# Patient Record
Sex: Female | Born: 1990 | Race: Black or African American | Hispanic: No | Marital: Single | Smoking: Former smoker
Health system: Southern US, Community
[De-identification: ages and names within clinical notes are randomized; demographics above are authoritative.]

## PROBLEM LIST (undated history)

## (undated) ENCOUNTER — Inpatient Hospital Stay (HOSPITAL_COMMUNITY): Payer: Self-pay

## (undated) DIAGNOSIS — S92909A Unspecified fracture of unspecified foot, initial encounter for closed fracture: Secondary | ICD-10-CM

## (undated) DIAGNOSIS — I10 Essential (primary) hypertension: Secondary | ICD-10-CM

## (undated) HISTORY — DX: Unspecified fracture of unspecified foot, initial encounter for closed fracture: S92.909A

## (undated) HISTORY — DX: Essential (primary) hypertension: I10

## (undated) HISTORY — PX: NO PAST SURGERIES: SHX2092

---

## 2015-02-14 ENCOUNTER — Emergency Department (INDEPENDENT_AMBULATORY_CARE_PROVIDER_SITE_OTHER)
Admission: EM | Admit: 2015-02-14 | Discharge: 2015-02-14 | Disposition: A | Discharge status: Home / Self Care | Payer: Self-pay | Source: Home / Self Care | Attending: Emergency Medicine | Admitting: Emergency Medicine

## 2015-02-14 ENCOUNTER — Encounter (HOSPITAL_COMMUNITY): Payer: Self-pay | Admitting: Emergency Medicine

## 2015-02-14 DIAGNOSIS — R519 Headache, unspecified: Secondary | ICD-10-CM

## 2015-02-14 DIAGNOSIS — M7671 Peroneal tendinitis, right leg: Secondary | ICD-10-CM

## 2015-02-14 DIAGNOSIS — R51 Headache: Secondary | ICD-10-CM

## 2015-02-14 MED ORDER — PREDNISONE 50 MG PO TABS: ORAL_TABLET | ORAL | Status: DC

## 2015-02-14 NOTE — Discharge Instructions (Signed)
The patient in your ankle is coming from inflamed tendons. Take prednisone daily for 5 days to help get rid of the inflammation. Apply ice as often as you can, but especially after work. Wear the brace whenever you are moving around. Do the exercises I showed you to help stretch and strengthen the tendons.  The goal is to do 3 sets of 10 twice a day. It will likely take some time before you reach this. You can also take Tylenol or Advil as needed for pain. If this not improving in the next 1-2 weeks, please follow-up at the sports medicine center.  Your headaches are likely coming from an out-of-date glasses prescription. Please get your vision checked to see if you need new glasses.

## 2015-02-14 NOTE — ED Notes (Signed)
Multiple complaints.  Patient reports right ankle pain, no known injury.  Pain for greater than 6 months Patient reports headaches.  Reports headaches occur intermittently.  Occurs every 3 days randomly and lasts 30 minutes and blurry vision, then all resolves.  Patient reports sharp, shooting chest pain across top of chest with headaches.  These episodes have been going on for 2 months.  Patient has seen physicians for all this and has been told to get a pcp.  Patient has medicaid and has not reapplied.

## 2015-02-14 NOTE — ED Provider Notes (Addendum)
CSN: [id number]     Arrival date & time 02/14/15  1300 History   First MD Initiated Contact with Patient 02/14/15 1318     Chief Complaint  Patient presents with  . Headache   (Consider location/radiation/quality/duration/timing/severity/associated sxs/prior Treatment) HPI She is a 25 year old woman here for evaluation of right ankle pain and headache.  She states she's been having ankle pain for the last 6 months or so. It has been worse in the last month since she fell at work. She states she has seen several other doctors, but no one has been able to tell her what is going on. The pain is in the lateral aspect of the ankle. It is worse with pointing and flexing the foot. It is worse after working a shift at work. She does wear supportive shoes at work. She also reports some swelling at the lateral ankle after work.  She also reports frequent headaches over the last several months. She states they occur every second to third day. They last about 30 minutes and resolve spontaneously or with medication. They're located frontally and behind the eyes. Sometimes she feels nauseous or sensitive to light with them but not all the time. She does wear glasses and has not had an eye exam in several years.  History reviewed. No pertinent past medical history. History reviewed. No pertinent past surgical history. No family history on file. Social History  Substance Use Topics  . Smoking status: Never Smoker   . Smokeless tobacco: None  . Alcohol Use: No   OB History    No data available     Review of Systems As in history of present illness Allergies  Review of patient's allergies indicates no known allergies.  Home Medications   Prior to Admission medications   Medication Sig Start Date End Date Taking? Authorizing Provider  predniSONE (DELTASONE) 50 MG tablet Take 1 pill daily for 5 days. 02/14/15   Charm Rings, MD   Meds Ordered and Administered this Visit  Medications - No data to  display  BP 135/84 mmHg  Pulse 69  Temp(Src) 98.5 F (36.9 C) (Oral)  Resp 16  SpO2 95% No data found.   Physical Exam  Constitutional: She is oriented to person, place, and time. She appears well-developed and well-nourished. No distress.  Cardiovascular: Normal rate.   Pulmonary/Chest: Effort normal.  Musculoskeletal:  Right ankle: Mild swelling at the lateral ankle. No erythema. She has full active range of motion and 5 out of 5 strength in the ankle. She does have some point tenderness along the peroneal tendons. She has pain with stretching of the peroneal tendons. 2+ DP pulse.  Neurological: She is alert and oriented to person, place, and time.  Normal gait    ED Course  Procedures (including critical care time)  Labs Review Labs Reviewed - No data to display  Imaging Review No results found.   MDM   1. Peroneal tendonitis, right   2. Headache, unspecified headache type    Treat tendinitis with prednisone, ASO brace, icing, and rehabilitation exercises. Discussed that this will take weeks to months to heal. Follow-up with sports medicine if not improving. I suspect her headaches are secondary to an out-of-date glasses prescription. Recommended follow-up with optometrist to get a new prescription.    Charm Rings, MD 02/14/15 4098  Charm Rings, MD 02/14/15 1191

## 2016-10-29 ENCOUNTER — Ambulatory Visit
Admission: EM | Admit: 2016-10-29 | Discharge: 2016-10-29 | Disposition: A | Discharge status: Home / Self Care | Payer: 59 | Attending: Family Medicine | Admitting: Family Medicine

## 2016-10-29 ENCOUNTER — Encounter: Payer: Self-pay | Admitting: *Deleted

## 2016-10-29 DIAGNOSIS — Z3202 Encounter for pregnancy test, result negative: Secondary | ICD-10-CM | POA: Diagnosis not present

## 2016-10-29 DIAGNOSIS — N3000 Acute cystitis without hematuria: Secondary | ICD-10-CM

## 2016-10-29 DIAGNOSIS — R1030 Lower abdominal pain, unspecified: Secondary | ICD-10-CM

## 2016-10-29 DIAGNOSIS — R51 Headache: Secondary | ICD-10-CM

## 2016-10-29 LAB — URINALYSIS, COMPLETE (UACMP) WITH MICROSCOPIC
Bilirubin Urine: NEGATIVE
GLUCOSE, UA: NEGATIVE mg/dL
Ketones, ur: NEGATIVE mg/dL
Leukocytes, UA: NEGATIVE
Nitrite: POSITIVE — AB
PH: 7 (ref 5.0–8.0)
Protein, ur: NEGATIVE mg/dL
SPECIFIC GRAVITY, URINE: 1.02 (ref 1.005–1.030)

## 2016-10-29 LAB — PREGNANCY, URINE: PREG TEST UR: NEGATIVE

## 2016-10-29 MED ORDER — CEPHALEXIN 500 MG PO CAPS: 500.0000 mg | ORAL_CAPSULE | Freq: Two times a day (BID) | ORAL | 0 refills | Status: DC

## 2016-10-29 NOTE — ED Triage Notes (Signed)
For over two weeks pt has been having lower abd cramping and nausea. Pt had two periods in one moth.

## 2016-10-29 NOTE — Discharge Instructions (Signed)
Antibiotic as prescribed.  Please follow up with your PCP or OB GYN.  Take care  Dr. Adriana Simas

## 2016-10-29 NOTE — ED Provider Notes (Signed)
[facility]    CSN: [id number] Arrival date & time: 10/29/16  0848     History   Chief Complaint Chief Complaint  Patient presents with  . Abdominal Cramping  . Headache   HPI  26 year old female presents with lower abdominal pain, nausea, headache, menstrual issues.  Patient reports a two-week history of sharp bilateral lower abdominal pain. Associated nausea. She states she's had intermittent headaches as well. Had a cycle approximately 2 weeks ago and has recently started spotting. She has an IUD in place. No vomiting. No fever or chills. Patient does endorse some urinary frequency. No dysuria.. She states that her blood pressure has been a little high. She is not followed regularly by a physician. She seems to be concerned that she may be pregnant. No known exacerbating or relieving factors. No other reported symptoms. No other complaints or concerns at this time.  History reviewed. No pertinent past medical history.  History reviewed. No pertinent surgical history.  OB History    No data available     Home Medications    Prior to Admission medications   Medication Sig Start Date End Date Taking? Authorizing Provider  cephALEXin (KEFLEX) 500 MG capsule Take 1 capsule (500 mg total) by mouth 2 (two) times daily. 10/29/16   Tommie Sams, DO  predniSONE (DELTASONE) 50 MG tablet Take 1 pill daily for 5 days. 02/14/15   Charm Rings, MD   Family History History reviewed. No pertinent family history.  Social History Social History  Substance Use Topics  . Smoking status: Current Every Day Smoker    Years: 2.00    Types: Cigars  . Smokeless tobacco: Never Used     Comment: 1  day  . Alcohol use No   Allergies   Patient has no known allergies.  Review of Systems Review of Systems  Constitutional: Negative.   Gastrointestinal: Positive for abdominal pain and nausea.  Genitourinary: Positive for menstrual problem.  Neurological: Positive for  headaches.   Physical Exam Triage Vital Signs ED Triage Vitals  Enc Vitals Group     BP 10/29/16 0902 (!) 150/89     Pulse Rate 10/29/16 0902 90     Resp 10/29/16 0902 12     Temp 10/29/16 0902 99 F (37.2 C)     Temp Source 10/29/16 0902 Oral     SpO2 10/29/16 0902 100 %     Weight 10/29/16 0907 210 lb (95.3 kg)     Height 10/29/16 0907  (1.626 m)     Head Circumference --      Peak Flow --      Pain Score 10/29/16 0907 8     Pain Loc --      Pain Edu? --      Excl. in GC? --     Updated Vital Signs BP (!) 150/89 (BP Location: Left Arm)   Pulse 90   Temp 99 F (37.2 C) (Oral)   Resp 12   Ht  (1.626 m)   Wt 210 lb (95.3 kg)   LMP 10/24/2016   SpO2 100%   BMI 36.05 kg/m   Physical Exam  Constitutional: She is oriented to person, place, and time. She appears well-developed. No distress.  Cardiovascular: Normal rate and regular rhythm.   No murmur heard. Pulmonary/Chest: Effort normal. No respiratory distress. She has no wheezes. She has no rales.  Abdominal: Soft. She exhibits no distension.  Tenderness in both the RLQ  and the LLQ. No rebound or guarding.  Neurological: She is alert and oriented to person, place, and time.  Psychiatric:  Flat affect.  Vitals reviewed.  UC Treatments / Results  Labs (all labs ordered are listed, but only abnormal results are displayed) Labs Reviewed  URINALYSIS, COMPLETE (UACMP) WITH MICROSCOPIC - Abnormal; Notable for the following:       Result Value   APPearance HAZY (*)    Hgb urine dipstick SMALL (*)    Nitrite POSITIVE (*)    Squamous Epithelial / LPF 0-5 (*)    Bacteria, UA MANY (*)    All other components within normal limits  URINE CULTURE  PREGNANCY, URINE   EKG  EKG Interpretation None       Radiology No results found.  Procedures Procedures (including critical care time)  Medications Ordered in UC Medications - No data to display   Initial Impression / Assessment and Plan / UC Course    I have reviewed the triage vital signs and the nursing notes.  Pertinent labs & imaging results that were available during my care of the patient were reviewed by me and considered in my medical decision making (see chart for details).    26 year old female presents with lower abdominal pain.  She does endorse some urinary frequency and her urine is suggestive of UTI. Treating for UTI with Keflex. Advised to follow-up with her OB/GYN or primary care physician.  Final Clinical Impressions(s) / UC Diagnoses   Final diagnoses:  Lower abdominal pain  Acute cystitis without hematuria    New Prescriptions New Prescriptions   CEPHALEXIN (KEFLEX) 500 MG CAPSULE    Take 1 capsule (500 mg total) by mouth 2 (two) times daily.    Controlled Substance Prescriptions [place] Controlled Substance Registry consulted? Not Applicable   Tommie Sams, DO 10/29/16 4540

## 2016-10-31 LAB — URINE CULTURE: Culture: >=100000 — AB

## 2016-12-18 ENCOUNTER — Ambulatory Visit
Admission: EM | Admit: 2016-12-18 | Discharge: 2016-12-18 | Disposition: A | Discharge status: Home / Self Care | Payer: 59 | Attending: Family Medicine | Admitting: Family Medicine

## 2016-12-18 ENCOUNTER — Other Ambulatory Visit: Payer: Self-pay

## 2016-12-18 ENCOUNTER — Encounter: Payer: Self-pay | Admitting: Emergency Medicine

## 2016-12-18 DIAGNOSIS — N898 Other specified noninflammatory disorders of vagina: Secondary | ICD-10-CM | POA: Diagnosis not present

## 2016-12-18 DIAGNOSIS — R102 Pelvic and perineal pain: Secondary | ICD-10-CM | POA: Diagnosis not present

## 2016-12-18 DIAGNOSIS — Z202 Contact with and (suspected) exposure to infections with a predominantly sexual mode of transmission: Secondary | ICD-10-CM

## 2016-12-18 DIAGNOSIS — Z3202 Encounter for pregnancy test, result negative: Secondary | ICD-10-CM

## 2016-12-18 DIAGNOSIS — Z113 Encounter for screening for infections with a predominantly sexual mode of transmission: Secondary | ICD-10-CM | POA: Diagnosis not present

## 2016-12-18 DIAGNOSIS — B9689 Other specified bacterial agents as the cause of diseases classified elsewhere: Secondary | ICD-10-CM

## 2016-12-18 DIAGNOSIS — R300 Dysuria: Secondary | ICD-10-CM

## 2016-12-18 DIAGNOSIS — N760 Acute vaginitis: Secondary | ICD-10-CM

## 2016-12-18 DIAGNOSIS — N730 Acute parametritis and pelvic cellulitis: Secondary | ICD-10-CM

## 2016-12-18 DIAGNOSIS — N732 Unspecified parametritis and pelvic cellulitis: Secondary | ICD-10-CM

## 2016-12-18 LAB — URINALYSIS, COMPLETE (UACMP) WITH MICROSCOPIC
Bacteria, UA: NONE SEEN
Bilirubin Urine: NEGATIVE
GLUCOSE, UA: NEGATIVE mg/dL
Ketones, ur: NEGATIVE mg/dL
LEUKOCYTES UA: NEGATIVE
NITRITE: NEGATIVE
PH: 7 (ref 5.0–8.0)
Protein, ur: NEGATIVE mg/dL
SPECIFIC GRAVITY, URINE: 1.02 (ref 1.005–1.030)

## 2016-12-18 LAB — WET PREP, GENITAL
SPERM: NONE SEEN
TRICH WET PREP: NONE SEEN
YEAST WET PREP: NONE SEEN

## 2016-12-18 LAB — HCG, QUANTITATIVE, PREGNANCY: hCG, Beta Chain, Quant, S: <1 m[IU]/mL (ref <5)

## 2016-12-18 LAB — CHLAMYDIA/NGC RT PCR (ARMC ONLY)
Chlamydia Tr: NOT DETECTED
N gonorrhoeae: DETECTED — AB

## 2016-12-18 LAB — PREGNANCY, URINE: Preg Test, Ur: NEGATIVE

## 2016-12-18 MED ORDER — METRONIDAZOLE 500 MG PO TABS: 500.0000 mg | ORAL_TABLET | Freq: Two times a day (BID) | ORAL | 0 refills | Status: AC

## 2016-12-18 MED ORDER — CEFTRIAXONE SODIUM 250 MG IJ SOLR
250.0000 mg | Freq: Once | INTRAMUSCULAR | Status: AC
  Administered 2016-12-18: 250 mg via INTRAMUSCULAR

## 2016-12-18 MED ORDER — DOXYCYCLINE HYCLATE 100 MG PO CAPS
100.0000 mg | ORAL_CAPSULE | Freq: Two times a day (BID) | ORAL | 0 refills | Status: AC
Start: 2016-12-18 — End: 2017-01-01

## 2016-12-18 MED ORDER — AZITHROMYCIN 500 MG PO TABS
1000.0000 mg | ORAL_TABLET | Freq: Once | ORAL | Status: AC
  Administered 2016-12-18: 250 mg via ORAL

## 2016-12-18 NOTE — ED Triage Notes (Addendum)
Patient c/o lower abdominal pain and burning when urinating that started last week.  Patient reports some nausea.   Patient also reports that her boyfriend recently tested positive for Chlamydia.

## 2016-12-18 NOTE — ED Provider Notes (Addendum)
[facility] ____________________________________________  Time seen: Approximately 10:12 AM  I have reviewed the triage vital signs and the nursing notes.   HISTORY  Chief Complaint Dysuria and Abdominal Pain  HPI Christina Riggs is a 26 y.o. female presenting for evaluation of approximately 2 weeks of occasional  discomfort with urination, lower pelvic discomfort and some pelvic vaginal discharge.  States only occasional vaginal discharge.  Reports that she is sexually active with one partner.  States that her sexual partner recently cheated on her and contracted chlamydia.  States that she and her partner do not use condoms.  Has not had sexual activity since he has been treated, however did around the same time prior to him knowing of chlamydia.  Patient reports gravida 2 para 2.  States that that she does have an IUD.  States that she usually has some vaginal bleeding monthly for menstrual cycle, but reports intermittently light.  States last week she had light bleeding, and wanted to verify not pregnant.  States occasional nausea.  Patient denies any vomiting, diarrhea, fever.  Reports continues to eat and drink well. Reports continues to remain active. Patient consents to other STD testing.  Denies any rash or lesions.  States pelvic discomfort has been coming and going over the last 2 weeks, states at times moderate, currently mild.  Denies other complaints. Has not taken any over-the-counter medications for the same complaints.  Denies other aggravating or relieving factors.  Denies chest pain, shortness of breath, extremity pain, extremity swelling or rash. Denies recent sickness. Denies recent antibiotic use.   History reviewed. No pertinent past medical history. Denies   There are no active problems to display for this patient.   History reviewed. No pertinent surgical history.   No current facility-administered medications for this encounter.   Current Outpatient  Medications:  .  levonorgestrel (MIRENA) 20 MCG/24HR IUD, 1 each by Intrauterine route once., Disp: , Rfl:  .  doxycycline (VIBRAMYCIN) 100 MG capsule, Take 1 capsule (100 mg total) by mouth 2 (two) times daily for 14 days., Disp: 28 capsule, Rfl: 0 .  metroNIDAZOLE (FLAGYL) 500 MG tablet, Take 1 tablet (500 mg total) by mouth 2 (two) times daily for 14 days., Disp: 28 tablet, Rfl: 0  Allergies Patient has no known allergies.  Family History  Problem Relation Age of Onset  . Hypertension Father   . Stroke Father     Social History Social History   Tobacco Use  . Smoking status: Current Every Day Smoker    Years: 2.00    Types: Cigars  . Smokeless tobacco: Never Used  . Tobacco comment: 1  day  Substance Use Topics  . Alcohol use: No  . Drug use: No    Review of Systems Constitutional: No fever/chills ENT: No sore throat. Cardiovascular: Denies chest pain. Respiratory: Denies shortness of breath. Gastrointestinal: As above. No vomiting.  No diarrhea.  No constipation. Genitourinary: As above.  Musculoskeletal: Negative for back pain. Skin: Negative for rash.  ____________________________________________   PHYSICAL EXAM:  VITAL SIGNS: ED Triage Vitals  Enc Vitals Group     BP 12/18/16 0903 (!) 142/86     Pulse Rate 12/18/16 0903 84     Resp 12/18/16 0903 16     Temp 12/18/16 0903 98.8 F (37.1 C)     Temp Source 12/18/16 0903 Oral     SpO2 12/18/16 0903 100 %     Weight 12/18/16 0900 242 lb 3.2 oz (109.9 kg)  Height 12/18/16 0900 5\' 4"  (1.626 m)     Head Circumference --      Peak Flow --      Pain Score 12/18/16 0901 9     Pain Loc --      Pain Edu? --      Excl. in GC? --     Constitutional: Alert and oriented. Well appearing and in no acute distress. ENT      Head: Normocephalic and atraumatic.      Mouth/Throat: Mucous membranes are moist. Cardiovascular: Normal rate, regular rhythm. Grossly normal heart sounds.  Good peripheral  circulation. Respiratory: Normal respiratory effort without tachypnea nor retractions. Breath sounds are clear and equal bilaterally. No wheezes, rales, rhonchi. Gastrointestinal: Soft. Minimal tenderness suprapubic. Abdomen otherwise soft and nontender. Normal Bowel sounds. No CVA tenderness. Pelvic: Pelvic exam completed with Nisha CMA at bedside as chaperone. External: Normal appearance, no rash or lesions noted. Speculum: mild whitish vaginal discharge, IUD strings visible, no bleeding, no foreign body. Bimanual: Mild to moderate cervical motion tenderness. Minimal bilateral adnexal tenderness to palpation. Musculoskeletal: No midline cervical, thoracic or lumbar tenderness to palpation.  Neurologic:  Normal speech and language.  Speech is normal. No gait instability.  Skin:  Skin is warm, dry and intact. No rash noted. Psychiatric: Mood and affect are normal. Speech and behavior are normal. Patient exhibits appropriate insight and judgment   ___________________________________________   LABS (all labs ordered are listed, but only abnormal results are displayed)  Labs Reviewed  WET PREP, GENITAL - Abnormal; Notable for the following components:      Result Value   Clue Cells Wet Prep HPF POC PRESENT (*)    WBC, Wet Prep HPF POC MODERATE (*)    All other components within normal limits  URINALYSIS, COMPLETE (UACMP) WITH MICROSCOPIC - Abnormal; Notable for the following components:   APPearance HAZY (*)    Hgb urine dipstick TRACE (*)    Squamous Epithelial / LPF 0-5 (*)    All other components within normal limits  URINE CULTURE  CHLAMYDIA/NGC RT PCR ([facility] ONLY)  PREGNANCY, URINE  HCG, QUANTITATIVE, PREGNANCY  RPR  HIV ANTIBODY (ROUTINE TESTING)  HSV(HERPES SIMPLEX VRS) I + II AB-IGG  HSV(HERPES SIMPLEX VRS) I + II AB-IGM  HEPATITIS PANEL, ACUTE   RADIOLOGY  No results found. ____________________________________________   PROCEDURES Procedures    INITIAL  IMPRESSION / ASSESSMENT AND PLAN / ED COURSE  Pertinent labs & imaging results that were available during my care of the patient were reviewed by me and considered in my medical decision making (see chart for details).  Well appearing patient.  No acute distress.  Recent probable exposure to STD.  1 g oral erythromycin and 250 mg IM Rocephin given once in urgent care.  Also suspect PID.  Afebrile and denies fevers at home.  Reports continues to eat and drink well.  Abdomen otherwise soft and nontender other than suprapubic tenderness.  Awaiting labs.  Will treat patient with oral doxycycline as well as Flagyl.  Discussed very strict follow-up and return parameters.  No sexual activity for at least 1 week and then until after follow-up.  No ultrasound availability at this facility at this time.  Discussed for any fevers, increase pain or no improvement, proceeding directly to the emergency room for imaging, likely ultrasound.  Encourage rest, fluids, supportive care. Discussed indication, risks and benefits of medications with patient, no alcohol with flagyl.   Discussed follow up with Primary care physician this  week. Discussed follow up and return parameters including no resolution or any worsening concerns. Patient verbalized understanding and agreed to plan.   ____________________________________________   FINAL CLINICAL IMPRESSION(S) / ED DIAGNOSES  Final diagnoses:  STD exposure  PID (acute pelvic inflammatory disease)  Bacterial vaginosis     ED Discharge Orders        Ordered    doxycycline (VIBRAMYCIN) 100 MG capsule  2 times daily     12/18/16 1058    metroNIDAZOLE (FLAGYL) 500 MG tablet  2 times daily     12/18/16 1058       Note: This dictation was prepared with Dragon dictation along with smaller phrase technology. Any transcriptional errors that result from this process are unintentional.         Renford Dills, NP 12/18/16 1604    Renford Dills, NP 12/18/16  1606

## 2016-12-18 NOTE — Discharge Instructions (Signed)
Take medication as prescribed. Rest. Drink plenty of fluids.  No sexual activity until finished treatment and have follow-up.  All partner should be treated.  Follow up with your primary care physician this week, or follow-up with [facility].  Return to Urgent care as needed.  Proceed directly to the emergency room for any fever, increase pelvic or vaginal pain, inability to tolerate food or medications, or worsening concerns.

## 2016-12-19 LAB — HEPATITIS PANEL, ACUTE
HCV Ab: <0.1 s/co ratio (ref 0.0–0.9)
HEP A IGM: NEGATIVE
HEP B C IGM: NEGATIVE
Hepatitis B Surface Ag: NEGATIVE

## 2016-12-19 LAB — RPR: RPR Ser Ql: NONREACTIVE

## 2016-12-19 LAB — HIV ANTIBODY (ROUTINE TESTING W REFLEX): HIV Screen 4th Generation wRfx: NONREACTIVE

## 2016-12-19 LAB — HSV(HERPES SIMPLEX VRS) I + II AB-IGG
HSV 1 Glycoprotein G Ab, IgG: <0.91 index (ref 0.00–0.90)
HSV 2 Glycoprotein G Ab, IgG: 20.6 index — ABNORMAL HIGH (ref 0.00–0.90)

## 2016-12-20 LAB — URINE CULTURE

## 2016-12-22 LAB — HSV(HERPES SIMPLEX VRS) I + II AB-IGM: HSVI/II Comb IgM: 1.07 Ratio — ABNORMAL HIGH (ref 0.00–0.90)

## 2017-05-04 DIAGNOSIS — M545 Low back pain: Secondary | ICD-10-CM | POA: Diagnosis not present

## 2017-05-04 DIAGNOSIS — J029 Acute pharyngitis, unspecified: Secondary | ICD-10-CM | POA: Diagnosis not present

## 2018-01-27 ENCOUNTER — Encounter: Payer: Self-pay | Admitting: Emergency Medicine

## 2018-01-27 ENCOUNTER — Ambulatory Visit
Admission: EM | Admit: 2018-01-27 | Discharge: 2018-01-27 | Disposition: A | Discharge status: Home / Self Care | Payer: 59 | Attending: Family Medicine | Admitting: Family Medicine

## 2018-01-27 ENCOUNTER — Other Ambulatory Visit: Payer: Self-pay

## 2018-01-27 DIAGNOSIS — J111 Influenza due to unidentified influenza virus with other respiratory manifestations: Secondary | ICD-10-CM | POA: Insufficient documentation

## 2018-01-27 LAB — RAPID INFLUENZA A&B ANTIGENS (ARMC ONLY): INFLUENZA A (ARMC): POSITIVE — AB

## 2018-01-27 LAB — RAPID INFLUENZA A&B ANTIGENS: Influenza B (ARMC): NEGATIVE

## 2018-01-27 MED ORDER — OSELTAMIVIR PHOSPHATE 75 MG PO CAPS: 75.0000 mg | ORAL_CAPSULE | Freq: Two times a day (BID) | ORAL | 0 refills | Status: DC

## 2018-01-27 NOTE — ED Provider Notes (Signed)
[facility]    CSN: [id number] Arrival date & time: 01/27/18  1710     History   Chief Complaint Chief Complaint  Patient presents with  . Cough    HPI Christina Riggs is a 28 y.o. female.   The history is provided by the patient.  Cough  Associated symptoms: fever, myalgias and rhinorrhea   Associated symptoms: no wheezing   URI  Presenting symptoms: congestion, cough, fatigue, fever and rhinorrhea   Severity:  Moderate Onset quality:  Sudden Duration:  2 days Timing:  Constant Progression:  Worsening Chronicity:  New Relieved by:  None tried Ineffective treatments:  None tried Associated symptoms: myalgias   Associated symptoms: no wheezing   Risk factors: sick contacts     History reviewed. No pertinent past medical history.  There are no active problems to display for this patient.   Past Surgical History:  Procedure Laterality Date  . NO PAST SURGERIES      OB History   No obstetric history on file.      Home Medications    Prior to Admission medications   Medication Sig Start Date End Date Taking? Authorizing Provider  levonorgestrel (MIRENA) 20 MCG/24HR IUD 1 each by Intrauterine route once.   Yes [provider]  oseltamivir (TAMIFLU) 75 MG capsule Take 1 capsule (75 mg total) by mouth 2 (two) times daily. 01/27/18   Payton Mccallumonty, Kavon Valenza, MD    Family History Family History  Problem Relation Age of Onset  . Hypertension Father   . Stroke Father     Social History Social History   Tobacco Use  . Smoking status: Current Some Day Smoker    Years: 2.00    Types: Cigars  . Smokeless tobacco: Never Used  . Tobacco comment: 1  day  Substance Use Topics  . Alcohol use: No  . Drug use: No     Allergies   Patient has no known allergies.   Review of Systems Review of Systems  Constitutional: Positive for fatigue and fever.  HENT: Positive for congestion and rhinorrhea.   Respiratory: Positive for cough. Negative for  wheezing.   Musculoskeletal: Positive for myalgias.     Physical Exam Triage Vital Signs ED Triage Vitals  Enc Vitals Group     BP 01/27/18 1728 (!) 125/95     Pulse Rate 01/27/18 1728 (!) 112     Resp 01/27/18 1728 20     Temp 01/27/18 1728 (!) 100.4 F (38 C)     Temp Source 01/27/18 1728 Oral     SpO2 01/27/18 1728 96 %     Weight 01/27/18 1724 220 lb (99.8 kg)     Height 01/27/18 1724 5\' 4"  (1.626 m)     Head Circumference --      Peak Flow --      Pain Score 01/27/18 1724 9     Pain Loc --      Pain Edu? --      Excl. in GC? --    No data found.  Updated Vital Signs BP (!) 125/95 (BP Location: Left Arm)   Pulse (!) 112   Temp (!) 100.4 F (38 C) (Oral)   Resp 20   Ht 5\' 4"  (1.626 m)   Wt 99.8 kg   SpO2 96%   BMI 37.76 kg/m   Visual Acuity Right Eye Distance:   Left Eye Distance:   Bilateral Distance:    Right Eye Near:   Left Eye Near:  Bilateral Near:     Physical Exam   UC Treatments / Results  Labs (all labs ordered are listed, but only abnormal results are displayed) Labs Reviewed  RAPID INFLUENZA A&B ANTIGENS ([facility] ONLY)    EKG None  Radiology No results found.  Procedures Procedures (including critical care time)  Medications Ordered in UC Medications - No data to display  Initial Impression / Assessment and Plan / UC Course  I have reviewed the triage vital signs and the nursing notes.  Pertinent labs & imaging results that were available during my care of the patient were reviewed by me and considered in my medical decision making (see chart for details).      Final Clinical Impressions(s) / UC Diagnoses   Final diagnoses:  Influenza    ED Prescriptions    Medication Sig Dispense Auth. Provider   oseltamivir (TAMIFLU) 75 MG capsule Take 1 capsule (75 mg total) by mouth 2 (two) times daily. 10 capsule Payton Mccallum, MD     1. diagnosis reviewed with patient 2. rx as per orders above; reviewed possible side  effects, interactions, risks and benefits  3. Recommend supportive treatment with rest, fluids 4. Follow-up prn if symptoms worsen or don't improve    Controlled Substance Prescriptions [place] Controlled Substance Registry consulted? Not Applicable   Payton Mccallum, MD 01/27/18 [phone]

## 2018-01-27 NOTE — ED Triage Notes (Signed)
Pt c/o productive cough, body aches, chills, nasal congestion, nausea and feeling feverish. Started yesterday.

## 2018-06-21 ENCOUNTER — Ambulatory Visit (INDEPENDENT_AMBULATORY_CARE_PROVIDER_SITE_OTHER): Payer: 59

## 2018-06-21 ENCOUNTER — Other Ambulatory Visit: Payer: Self-pay

## 2018-06-21 ENCOUNTER — Ambulatory Visit
Admission: EM | Admit: 2018-06-21 | Discharge: 2018-06-21 | Disposition: A | Discharge status: Home / Self Care | Payer: 59 | Attending: Family Medicine | Admitting: Family Medicine

## 2018-06-21 DIAGNOSIS — K529 Noninfective gastroenteritis and colitis, unspecified: Secondary | ICD-10-CM

## 2018-06-21 NOTE — ED Triage Notes (Signed)
Patient states that she has been having abdominal pain that started over the last week. States that she has been having diarrhea as well. Patient reports that pain is all over.

## 2018-06-21 NOTE — Discharge Instructions (Addendum)
Clear liquids then advance to bland diet slowly Over the counter Imodium AD

## 2018-06-21 NOTE — ED Provider Notes (Signed)
[facility]    CSN: [id number] Arrival date & time: 06/21/18  1518     History   Chief Complaint Chief Complaint  Patient presents with  . Abdominal Pain    HPI Sorrel Hartstein is a 28 y.o. female.   28 yo female with a c/o abdominal pains associated with watery stools for the past week. Denies any vomiting, fevers, chills, melena, hematochezia.    Abdominal Pain    History reviewed. No pertinent past medical history.  There are no active problems to display for this patient.   Past Surgical History:  Procedure Laterality Date  . NO PAST SURGERIES      OB History   No obstetric history on file.      Home Medications    Prior to Admission medications   Medication Sig Start Date End Date Taking? Authorizing Provider  levonorgestrel (MIRENA) 20 MCG/24HR IUD 1 each by Intrauterine route once.   Yes [provider]  oseltamivir (TAMIFLU) 75 MG capsule Take 1 capsule (75 mg total) by mouth 2 (two) times daily. 01/27/18   Payton Mccallum, MD    Family History Family History  Problem Relation Age of Onset  . Hypertension Father   . Stroke Father     Social History Social History   Tobacco Use  . Smoking status: Current Some Day Smoker    Years: 2.00    Types: Cigars  . Smokeless tobacco: Never Used  . Tobacco comment: 1  day  Substance Use Topics  . Alcohol use: No  . Drug use: No     Allergies   Patient has no known allergies.   Review of Systems Review of Systems  Gastrointestinal: Positive for abdominal pain.     Physical Exam Triage Vital Signs ED Triage Vitals  Enc Vitals Group     BP 06/21/18 1548 (!) 139/93     Pulse Rate 06/21/18 1548 88     Resp 06/21/18 1548 18     Temp 06/21/18 1548 98.4 F (36.9 C)     Temp Source 06/21/18 1548 Oral     SpO2 06/21/18 1548 100 %     Weight 06/21/18 1546 212 lb (96.2 kg)     Height 06/21/18 1546 5\' 4"  (1.626 m)     Head Circumference --      Peak Flow --      Pain Score  06/21/18 1546 9     Pain Loc --      Pain Edu? --      Excl. in GC? --    No data found.  Updated Vital Signs BP (!) 139/93 (BP Location: Right Arm)   Pulse 88   Temp 98.4 F (36.9 C) (Oral)   Resp 18   Ht 5\' 4"  (1.626 m)   Wt 96.2 kg   SpO2 100%   BMI 36.39 kg/m   Visual Acuity Right Eye Distance:   Left Eye Distance:   Bilateral Distance:    Right Eye Near:   Left Eye Near:    Bilateral Near:     Physical Exam Vitals signs and nursing note reviewed.  Constitutional:      General: She is not in acute distress.    Appearance: She is not toxic-appearing or diaphoretic.  Abdominal:     General: Bowel sounds are normal. There is no distension.     Palpations: Abdomen is soft.     Tenderness: There is abdominal tenderness (mild; diffuse; no rebound or guarding). There  is no right CVA tenderness, left CVA tenderness, guarding or rebound.     Hernia: No hernia is present.  Neurological:     Mental Status: She is alert.      UC Treatments / Results  Labs (all labs ordered are listed, but only abnormal results are displayed) Labs Reviewed - No data to display  EKG None  Radiology Dg Abd 2 Views  Result Date: 06/21/2018 CLINICAL DATA:  Periumbilical abdominal pain over the last 5 days. EXAM: ABDOMEN - 2 VIEW COMPARISON:  None. FINDINGS: Bowel gas pattern within normal limits. Evidence of ileus or obstruction. No free air. No abnormal soft tissue calcification. IUD in place. No bone abnormality. IMPRESSION: No pathologic finding.  IUD in place. Electronically Signed   By: Paulina FusiMark  Shogry M.D.   On: 06/21/2018 16:40    Procedures Procedures (including critical care time)  Medications Ordered in UC Medications - No data to display  Initial Impression / Assessment and Plan / UC Course  I have reviewed the triage vital signs and the nursing notes.  Pertinent labs & imaging results that were available during my care of the patient were reviewed by me and considered in  my medical decision making (see chart for details).      Final Clinical Impressions(s) / UC Diagnoses   Final diagnoses:  Gastroenteritis     Discharge Instructions     Clear liquids then advance to bland diet slowly Over the counter Imodium AD    ED Prescriptions    None      1. x-ray results and diagnosis reviewed with patient 2. rx as per orders above; reviewed possible side effects, interactions, risks and benefits  3. Recommend supportive treatment as above 4. Follow-up prn if symptoms worsen or don't improve   Controlled Substance Prescriptions [place] Controlled Substance Registry consulted? Not Applicable   Payton Mccallumonty, Elida Harbin, MD 06/21/18 [phone]

## 2018-08-03 ENCOUNTER — Emergency Department (HOSPITAL_COMMUNITY): Payer: Self-pay

## 2018-08-03 ENCOUNTER — Emergency Department (HOSPITAL_COMMUNITY)
Admission: EM | Admit: 2018-08-03 | Discharge: 2018-08-03 | Disposition: A | Discharge status: Home / Self Care | Payer: Self-pay | Attending: Emergency Medicine | Admitting: Emergency Medicine

## 2018-08-03 ENCOUNTER — Encounter (HOSPITAL_COMMUNITY): Payer: Self-pay | Admitting: Emergency Medicine

## 2018-08-03 ENCOUNTER — Other Ambulatory Visit: Payer: Self-pay

## 2018-08-03 DIAGNOSIS — R0602 Shortness of breath: Secondary | ICD-10-CM | POA: Insufficient documentation

## 2018-08-03 DIAGNOSIS — R0789 Other chest pain: Secondary | ICD-10-CM | POA: Insufficient documentation

## 2018-08-03 DIAGNOSIS — M7918 Myalgia, other site: Secondary | ICD-10-CM | POA: Insufficient documentation

## 2018-08-03 DIAGNOSIS — Z720 Tobacco use: Secondary | ICD-10-CM | POA: Insufficient documentation

## 2018-08-03 DIAGNOSIS — R52 Pain, unspecified: Secondary | ICD-10-CM

## 2018-08-03 DIAGNOSIS — Z20828 Contact with and (suspected) exposure to other viral communicable diseases: Secondary | ICD-10-CM | POA: Insufficient documentation

## 2018-08-03 LAB — CBC WITH DIFFERENTIAL/PLATELET
Abs Immature Granulocytes: 0.01 10*3/uL (ref 0.00–0.07)
Basophils Absolute: 0 10*3/uL (ref 0.0–0.1)
Basophils Relative: 1 %
Eosinophils Absolute: 0.1 10*3/uL (ref 0.0–0.5)
Eosinophils Relative: 2 %
HCT: 40.4 % (ref 36.0–46.0)
Hemoglobin: 13 g/dL (ref 12.0–15.0)
Immature Granulocytes: 0 %
Lymphocytes Relative: 47 %
Lymphs Abs: 2.3 10*3/uL (ref 0.7–4.0)
MCH: 31.3 pg (ref 26.0–34.0)
MCHC: 32.2 g/dL (ref 30.0–36.0)
MCV: 97.3 fL (ref 80.0–100.0)
Monocytes Absolute: 0.4 10*3/uL (ref 0.1–1.0)
Monocytes Relative: 7 %
Neutro Abs: 2.1 10*3/uL (ref 1.7–7.7)
Neutrophils Relative %: 43 %
Platelets: 284 10*3/uL (ref 150–400)
RBC: 4.15 MIL/uL (ref 3.87–5.11)
RDW: 13.2 % (ref 11.5–15.5)
WBC: 4.8 10*3/uL (ref 4.0–10.5)
nRBC: 0 % (ref 0.0–0.2)

## 2018-08-03 LAB — BASIC METABOLIC PANEL
Anion gap: 8 (ref 5–15)
BUN: 11 mg/dL (ref 6–20)
CO2: 25 mmol/L (ref 22–32)
Calcium: 8.8 mg/dL — ABNORMAL LOW (ref 8.9–10.3)
Chloride: 104 mmol/L (ref 98–111)
Creatinine, Ser: 0.86 mg/dL (ref 0.44–1.00)
GFR calc Af Amer: >60 mL/min (ref >60)
GFR calc non Af Amer: >60 mL/min (ref >60)
Glucose, Bld: 98 mg/dL (ref 70–99)
Potassium: 3.9 mmol/L (ref 3.5–5.1)
Sodium: 137 mmol/L (ref 135–145)

## 2018-08-03 LAB — I-STAT BETA HCG BLOOD, ED (MC, WL, AP ONLY): I-stat hCG, quantitative: <5 m[IU]/mL (ref <5)

## 2018-08-03 LAB — TROPONIN I (HIGH SENSITIVITY): Troponin I (High Sensitivity): <2 ng/L (ref <18)

## 2018-08-03 MED ORDER — SODIUM CHLORIDE 0.9 % IV BOLUS
1000.0000 mL | Freq: Once | INTRAVENOUS | Status: AC
  Administered 2018-08-03: 1000 mL via INTRAVENOUS

## 2018-08-03 MED ORDER — ACETAMINOPHEN 325 MG PO TABS
650.0000 mg | ORAL_TABLET | Freq: Once | ORAL | Status: AC
  Administered 2018-08-03: 650 mg via ORAL
  Filled 2018-08-03: qty 2

## 2018-08-03 NOTE — ED Provider Notes (Signed)
[facility] EMERGENCY DEPARTMENT Provider Note   CSN: [id number] Arrival date & time: 08/03/18  1013    History   Chief Complaint Chief Complaint  Patient presents with  . Chest Pain  . Shortness of Breath  . bodyaches    HPI Christina Riggs is a 27 y.o. female otherwise healthy presents today for a 4-day history of generalized body aches, chest pain and shortness of breath.  Patient reports gradual onset of symptoms Friday, 07/30/2018 that have remained constant.  She describes her generalized body aches as a moderate intensity aching sensation worsened with movement and without alleviating factors, 1 Advil Saturday with minimal relief, no other medications.  Patient reports that at the same time she developed a aching sensation to her chest which has been constant x4 days without clear aggravating or alleviating factors, she reports occasional shortness of breath not worsened with exertion or position.  She denies fever/chills, cough/hemoptysis, extremity pain/swelling, exogenous estrogen use, history of cancer, recent trauma/surgery, history of blood clot.  Additionally she denies diaphoresis, abdominal pain, nausea/vomiting, diarrhea, vaginal bleeding/discharge, dysuria/hematuria or any additional concerns.  Patient does have the Mirena IUD, progestin.    HPI  History reviewed. No pertinent past medical history.  There are no active problems to display for this patient.   Past Surgical History:  Procedure Laterality Date  . NO PAST SURGERIES       OB History   No obstetric history on file.      Home Medications    Prior to Admission medications   Medication Sig Start Date End Date Taking? Authorizing Provider  levonorgestrel (MIRENA) 20 MCG/24HR IUD 1 each by Intrauterine route once.   Yes [provider]    Family History Family History  Problem Relation Age of Onset  . Hypertension Father   . Stroke Father     Social History  Social History   Tobacco Use  . Smoking status: Current Some Day Smoker    Years: 2.00    Types: Cigars  . Smokeless tobacco: Never Used  . Tobacco comment: 1  day  Substance Use Topics  . Alcohol use: No  . Drug use: No     Allergies   Patient has no known allergies.   Review of Systems Review of Systems Ten systems are reviewed and are negative for acute change except as noted in the HPI.  Physical Exam Updated Vital Signs BP 108/82   Pulse 71   Temp 98.6 F (37 C) (Oral)   Resp 16   Ht 5\' 6"  (1.676 m)   Wt 106.6 kg   SpO2 100%   BMI 37.93 kg/m   Physical Exam Constitutional:      General: She is not in acute distress.    Appearance: Normal appearance. She is well-developed. She is obese. She is not ill-appearing or diaphoretic.  HENT:     Head: Normocephalic and atraumatic.     Right Ear: External ear normal.     Left Ear: External ear normal.     Nose: Nose normal.  Eyes:     General: Vision grossly intact. Gaze aligned appropriately.     Pupils: Pupils are equal, round, and reactive to light.  Neck:     Musculoskeletal: Normal range of motion.     Trachea: Trachea and phonation normal. No tracheal deviation.  Cardiovascular:     Rate and Rhythm: Normal rate and regular rhythm.     Pulses:  Radial pulses are 2+ on the right side and 2+ on the left side.       Dorsalis pedis pulses are 2+ on the right side and 2+ on the left side.     Heart sounds: Normal heart sounds.  Pulmonary:     Effort: Pulmonary effort is normal. No accessory muscle usage or respiratory distress.     Breath sounds: Normal breath sounds.  Chest:     Chest wall: Tenderness present. No deformity or crepitus.  Abdominal:     General: There is no distension.     Palpations: Abdomen is soft.     Tenderness: There is no abdominal tenderness. There is no guarding or rebound.  Musculoskeletal: Normal range of motion.     Right lower leg: She exhibits no tenderness. No edema.      Left lower leg: She exhibits no tenderness. No edema.  Skin:    General: Skin is warm and dry.  Neurological:     Mental Status: She is alert.     GCS: GCS eye subscore is 4. GCS verbal subscore is 5. GCS motor subscore is 6.     Comments: Speech is clear and goal oriented, follows commands Major Cranial nerves without deficit, no facial droop Moves extremities without ataxia, coordination intact  Psychiatric:        Behavior: Behavior normal.      ED Treatments / Results  Labs (all labs ordered are listed, but only abnormal results are displayed) Labs Reviewed  BASIC METABOLIC PANEL - Abnormal; Notable for the following components:      Result Value   Calcium 8.8 (*)    All other components within normal limits  NOVEL CORONAVIRUS, NAA (HOSPITAL ORDER, SEND-OUT TO REF LAB)  CBC WITH DIFFERENTIAL/PLATELET  I-STAT BETA HCG BLOOD, ED ([facility], [facility], [facility] ONLY)  TROPONIN I (HIGH SENSITIVITY)    EKG EKG Interpretation  Date/Time:  Tuesday August 03 2018 10:25:01 EDT Ventricular Rate:  80 PR Interval:    QRS Duration: 105 QT Interval:  375 QTC Calculation: 433 R Axis:   -18 Text Interpretation:  Normal sinus rhythm No old tracing to compare Confirmed by Margarita Grizzleay, Danielle [phone]([id number]) on 08/03/2018 12:25:02 PM   Radiology Dg Chest Portable 1 View  Result Date: 08/03/2018 CLINICAL DATA:  Chest pain and shortness of breath for 4 days. EXAM: PORTABLE CHEST 1 VIEW COMPARISON:  None. FINDINGS: The cardiac silhouette, mediastinal and hilar contours are within normal limits given the AP projection and portable technique. The lungs are clear. No infiltrates, edema or effusions. No pulmonary lesions. The bony thorax is intact. IMPRESSION: No acute cardiopulmonary findings. Electronically Signed   By: Rudie MeyerP.  Gallerani M.D.   On: 08/03/2018 11:20    Procedures Procedures (including critical care time)  Medications Ordered in ED Medications  sodium chloride 0.9 % bolus 1,000 mL (1,000 mLs Intravenous  New Bag/Given 08/03/18 1128)  acetaminophen (TYLENOL) tablet 650 mg (650 mg Oral Given 08/03/18 1127)     Initial Impression / Assessment and Plan / ED Course  I have reviewed the triage vital signs and the nursing notes.  Pertinent labs & imaging results that were available during my care of the patient were reviewed by me and considered in my medical decision making (see chart for details).    Initial evaluation reveals a well-appearing patient in no acute distress.  Heart regular rate and rhythm without murmur, lungs clear to auscultation bilaterally, abdomen soft nontender without distention or peritoneal signs, extremities neurovascular intact  x4.  She is low risk by Wells and PERC negative.  No tachycardia or hypoxia on room air. - Beta-hCG negative CBC within normal limits CXR:  IMPRESSION:  No acute cardiopulmonary findings.  High-sensitivity troponin less than 2, after 5 days of symptoms no indication for delta troponin BMP nonacute EKG: Normal sinus rhythm No old tracing to compare Confirmed by Margarita Grizzleay, Danielle [phone]([id number]) on 08/03/2018 12:25:02 PM  Patient with consistently reproducible chest pain to palpation of the chest wall and movement of the arms.  Suspect musculoskeletal etiology of her symptoms today.  With reassuring EKG and troponin after 5 days of symptoms do not suspect ACS.  As patient is low risk by Anner CreteWells and PERC negative doubt pulmonary embolism, based on physical examination, history and work-up today do not suspect dissection or other acute cardiopulmonary etiologies of her symptoms today.  Patient appears stable for discharge with primary care follow-up, heart score less than 4.  As to patient's generalized body aches possibility of viral illness today, no sign of pericarditis.  She is well-appearing and in no acute distress no abdominal pain nausea or vomiting, no fever or chills.  Will test patient with outpatient COVID-19 test today, advised self quarantine for 2 weeks  and symptom-free for 7 days, follow-up with PCP with telephone visit.  Return for any new or worsening symptoms.  Encourage water intake and OTC anti-inflammatories/Tylenol.  No hypoxia or tachycardia on room air, no infiltrates on chest x-ray appears stable for outpatient treatment.  At this time there does not appear to be any evidence of an acute emergency medical condition and the patient appears stable for discharge with appropriate outpatient follow up. Diagnosis was discussed with patient who verbalizes understanding of care plan and is agreeable to discharge. I have discussed return precautions with patient  who verbalizes understanding of return precautions. Patient encouraged to follow-up with their PCP. All questions answered.  Gladstone Lighterlyssa Mcadory was evaluated in Emergency Department on 08/03/2018 for the symptoms described in the history of present illness. She was evaluated in the context of the global COVID-19 pandemic, which necessitated consideration that the patient might be at risk for infection with the SARS-CoV-2 virus that causes COVID-19. Institutional protocols and algorithms that pertain to the evaluation of patients at risk for COVID-19 are in a state of rapid change based on information released by regulatory bodies including the CDC and federal and state organizations. These policies and algorithms were followed during the patient's care in the ED.  Patient's case discussed with Dr. Rosalia Hammersay who agrees with plan to outpatient COVID 19 test and discharge with PCP follow-up.   Note: Portions of this report may have been transcribed using voice recognition software. Every effort was made to ensure accuracy; however, inadvertent computerized transcription errors may still be present. Final Clinical Impressions(s) / ED Diagnoses   Final diagnoses:  Atypical chest pain  Generalized body aches    ED Discharge Orders    None       Elizabeth PalauMorelli, Vandora Jaskulski A, [name] 08/03/18 1326    Margarita Grizzleay,  Danielle, MD 08/06/18 1350

## 2018-08-03 NOTE — ED Triage Notes (Signed)
Pt co cp sob and body aches since Friday. Pt states she took some advil at home on Saturday. Pt denies having a fever at home. Denies n/v diarrhea.

## 2018-08-03 NOTE — ED Notes (Signed)
Pt given dc instructions  Pt verbalizes understanding  

## 2018-08-03 NOTE — Discharge Instructions (Addendum)
You have been diagnosed today with atypical chest pain, generalized body aches.  At this time there does not appear to be the presence of an emergent medical condition, however there is always the potential for conditions to change. Please read and follow the below instructions.  Please return to the Emergency Department immediately for any new or worsening symptoms. Please be sure to follow up with your Primary Care Provider within one week regarding your visit today; please call their office to schedule an appointment even if you are feeling better for a follow-up visit. You have been tested today for the COVID-19 virus.  Your results will be available on your MyChart account in the next 2-3 days.  Please continue self quarantining for the next 2 weeks and symptom-free for 7 days.  Continue your self quarantine even if your test is negative as there is a chance of a false negative test.  Please see the instructions below on self quarantining instructions.  Please call your primary care provider to establish a telephone follow-up visit this week and return to the emergency department for any new or worsening symptoms.  Get help right away if: Your chest pain is worse. You have a cough that gets worse, or you cough up blood. You have very bad (severe) pain in your belly (abdomen). You pass out (faint). You have either of these for no clear reason: Sudden chest discomfort. Sudden discomfort in your arms, back, neck, or jaw. You have shortness of breath at any time. You suddenly start to sweat, or your skin gets clammy. You feel sick to your stomach (nauseous). You throw up (vomit). You suddenly feel lightheaded or dizzy. You feel very weak or tired. Your heart starts to beat fast, or it feels like it is skipping beats. Any new/concerning or worsening symptoms  Please read the additional information packets attached to your discharge summary.  Do not take your medicine if  develop an itchy  rash, swelling in your mouth or lips, or difficulty breathing; call 911 and seek immediate emergency medical attention if this occurs. ============  If you live with, or provide care at home for, a person confirmed to have, or being evaluated for, COVID-19 infection please follow these guidelines to prevent infection:  Follow healthcare providers instructions Make sure that you understand and can help the patient follow any healthcare provider instructions for all care.  Provide for the patients basic needs You should help the patient with basic needs in the home and provide support for getting groceries, prescriptions, and other personal needs.  Monitor the patients symptoms If they are getting sicker, call his or her medical provider a  This will help the healthcare providers office take steps to keep other people from getting infected. Ask the healthcare provider to call the local or state health department.  Limit the number of people who have contact with the patient If possible, have only one caregiver for the patient. Other household members should stay in another home or place of residence. If this is not possible, they should stay in another room, or be separated from the patient as much as possible. Use a separate bathroom, if available. Restrict visitors who do not have an essential need to be in the home.  Keep older adults, very young children, and other sick people away from the patient Keep older adults, very young children, and those who have compromised immune systems or chronic health conditions away from the patient. This includes people with chronic heart,  lung, or kidney conditions, diabetes, and cancer.  Ensure good ventilation Make sure that shared spaces in the home have good air flow, such as from an air conditioner or an opened window, weather permitting.  Wash your hands often Wash your hands often and thoroughly with soap and water for at least 20  seconds. You can use an alcohol based hand sanitizer if soap and water are not available and if your hands are not visibly dirty. Avoid touching your eyes, nose, and mouth with unwashed hands. Use disposable paper towels to dry your hands. If not available, use dedicated cloth towels and replace them when they become wet.  Wear a facemask and gloves Wear a disposable facemask at all times in the room and gloves when you touch or have contact with the patients blood, body fluids, and/or secretions or excretions, such as sweat, saliva, sputum, nasal mucus, vomit, urine, or feces.  Ensure the mask fits over your nose and mouth tightly, and do not touch it during use. Throw out disposable facemasks and gloves after using them. Do not reuse. Wash your hands immediately after removing your facemask and gloves. If your personal clothing becomes contaminated, carefully remove clothing and launder. Wash your hands after handling contaminated clothing. Place all used disposable facemasks, gloves, and other waste in a lined container before disposing them with other household waste. Remove gloves and wash your hands immediately after handling these items.  Do not share dishes, glasses, or other household items with the patient Avoid sharing household items. You should not share dishes, drinking glasses, cups, eating utensils, towels, bedding, or other items After the person uses these items, you should wash them thoroughly with soap and water.  Wash laundry thoroughly Immediately remove and wash clothes or bedding that have blood, body fluids, and/or secretions or excretions, such as sweat, saliva, sputum, nasal mucus, vomit, urine, or feces, on them. Wear gloves when handling laundry from the patient. Read and follow directions on labels of laundry or clothing items and detergent. In general, wash and dry with the warmest temperatures recommended on the label.  Clean all areas the individual has used  often Clean all touchable surfaces, such as counters, tabletops, doorknobs, bathroom fixtures, toilets, phones, keyboards, tablets, and bedside tables, every day. Also, clean any surfaces that may have blood, body fluids, and/or secretions or excretions on them. Wear gloves when cleaning surfaces the patient has come in contact with. Use a diluted bleach solution (e.g., dilute bleach with 1 part bleach and 10 parts water) or a household disinfectant with a label that says EPA-registered for coronaviruses. To make a bleach solution at home, add 1 tablespoon of bleach to 1 quart (4 cups) of water. For a larger supply, add  cup of bleach to 1 gallon (16 cups) of water. Read labels of cleaning products and follow recommendations provided on product labels. Labels contain instructions for safe and effective use of the cleaning product including precautions you should take when applying the product, such as wearing gloves or eye protection and making sure you have good ventilation during use of the product. Remove gloves and wash hands immediately after cleaning.  Monitor yourself for signs and symptoms of illness Caregivers and household members are considered close contacts, should monitor their health, and will be asked to limit movement outside of the home to the extent possible. Follow the monitoring steps for close contacts listed on the symptom monitoring form.   ? If you have additional questions, contact your local  health department or call the epidemiologist on call at [phone] (available 24/7). ? This guidance is subject to change. For the most up-to-date guidance from [facility], please refer to their website: TripMetro.huhttps://www.cdc.gov/coronavirus/2019-ncov/hcp/guidance-prevent-spread.html

## 2018-08-04 LAB — NOVEL CORONAVIRUS, NAA (HOSP ORDER, SEND-OUT TO REF LAB; TAT 18-24 HRS): SARS-CoV-2, NAA: NOT DETECTED

## 2019-08-25 ENCOUNTER — Encounter: Payer: Self-pay | Admitting: Advanced Practice Midwife

## 2019-08-25 ENCOUNTER — Other Ambulatory Visit (HOSPITAL_COMMUNITY)
Admission: RE | Admit: 2019-08-25 | Discharge: 2019-08-25 | Disposition: A | Discharge status: Home / Self Care | Payer: Medicaid Other | Source: Ambulatory Visit | Attending: Advanced Practice Midwife | Admitting: Advanced Practice Midwife

## 2019-08-25 ENCOUNTER — Other Ambulatory Visit: Payer: Self-pay

## 2019-08-25 ENCOUNTER — Ambulatory Visit (INDEPENDENT_AMBULATORY_CARE_PROVIDER_SITE_OTHER): Payer: Medicaid Other | Admitting: Advanced Practice Midwife

## 2019-08-25 VITALS — BP 126/88 | Ht 66.0 in | Wt 269.0 lb

## 2019-08-25 DIAGNOSIS — Z124 Encounter for screening for malignant neoplasm of cervix: Secondary | ICD-10-CM | POA: Insufficient documentation

## 2019-08-25 DIAGNOSIS — Z113 Encounter for screening for infections with a predominantly sexual mode of transmission: Secondary | ICD-10-CM

## 2019-08-25 DIAGNOSIS — Z01419 Encounter for gynecological examination (general) (routine) without abnormal findings: Secondary | ICD-10-CM

## 2019-08-25 DIAGNOSIS — Z30432 Encounter for removal of intrauterine contraceptive device: Secondary | ICD-10-CM

## 2019-08-25 NOTE — Patient Instructions (Signed)
Health Maintenance, Female Adopting a healthy lifestyle and getting preventive care are important in promoting health and wellness. Ask your health care provider about:  The right schedule for you to have regular tests and exams.  Things you can do on your own to prevent diseases and keep yourself healthy. What should I know about diet, weight, and exercise? Eat a healthy diet   Eat a diet that includes plenty of vegetables, fruits, low-fat dairy products, and lean protein.  Do not eat a lot of foods that are high in solid fats, added sugars, or sodium. Maintain a healthy weight Body mass index (BMI) is used to identify weight problems. It estimates body fat based on height and weight. Your health care provider can help determine your BMI and help you achieve or maintain a healthy weight. Get regular exercise Get regular exercise. This is one of the most important things you can do for your health. Most adults should:  Exercise for at least 150 minutes each week. The exercise should increase your heart rate and make you sweat (moderate-intensity exercise).  Do strengthening exercises at least twice a week. This is in addition to the moderate-intensity exercise.  Spend less time sitting. Even light physical activity can be beneficial. Watch cholesterol and blood lipids Have your blood tested for lipids and cholesterol at 30 years of age, then have this test every 5 years. Have your cholesterol levels checked more often if:  Your lipid or cholesterol levels are high.  You are older than 30 years of age.  You are at high risk for heart disease. What should I know about cancer screening? Depending on your health history and family history, you may need to have cancer screening at various ages. This may include screening for:  Breast cancer.  Cervical cancer.  Colorectal cancer.  Skin cancer.  Lung cancer. What should I know about heart disease, diabetes, and high blood  pressure? Blood pressure and heart disease  High blood pressure causes heart disease and increases the risk of stroke. This is more likely to develop in people who have high blood pressure readings, are of African descent, or are overweight.  Have your blood pressure checked: ? Every 3-5 years if you are 18-39 years of age. ? Every year if you are 40 years old or older. Diabetes Have regular diabetes screenings. This checks your fasting blood sugar level. Have the screening done:  Once every three years after age 40 if you are at a normal weight and have a low risk for diabetes.  More often and at a younger age if you are overweight or have a high risk for diabetes. What should I know about preventing infection? Hepatitis B If you have a higher risk for hepatitis B, you should be screened for this virus. Talk with your health care provider to find out if you are at risk for hepatitis B infection. Hepatitis C Testing is recommended for:  Everyone born from 1945 through 1965.  Anyone with known risk factors for hepatitis C. Sexually transmitted infections (STIs)  Get screened for STIs, including gonorrhea and chlamydia, if: ? You are sexually active and are younger than 30 years of age. ? You are older than 30 years of age and your health care provider tells you that you are at risk for this type of infection. ? Your sexual activity has changed since you were last screened, and you are at increased risk for chlamydia or gonorrhea. Ask your health care provider if   you are at risk.  Ask your health care provider about whether you are at high risk for HIV. Your health care provider may recommend a prescription medicine to help prevent HIV infection. If you choose to take medicine to prevent HIV, you should first get tested for HIV. You should then be tested every 3 months for as long as you are taking the medicine. Pregnancy  If you are about to stop having your period (premenopausal) and  you may become pregnant, seek counseling before you get pregnant.  Take 400 to 800 micrograms (mcg) of folic acid every day if you become pregnant.  Ask for birth control (contraception) if you want to prevent pregnancy. Osteoporosis and menopause Osteoporosis is a disease in which the bones lose minerals and strength with aging. This can result in bone fractures. If you are 65 years old or older, or if you are at risk for osteoporosis and fractures, ask your health care provider if you should:  Be screened for bone loss.  Take a calcium or vitamin D supplement to lower your risk of fractures.  Be given hormone replacement therapy (HRT) to treat symptoms of menopause. Follow these instructions at home: Lifestyle  Do not use any products that contain nicotine or tobacco, such as cigarettes, e-cigarettes, and chewing tobacco. If you need help quitting, ask your health care provider.  Do not use street drugs.  Do not share needles.  Ask your health care provider for help if you need support or information about quitting drugs. Alcohol use  Do not drink alcohol if: ? Your health care provider tells you not to drink. ? You are pregnant, may be pregnant, or are planning to become pregnant.  If you drink alcohol: ? Limit how much you use to 0-1 drink a day. ? Limit intake if you are breastfeeding.  Be aware of how much alcohol is in your drink. In the [place] one drink equals one 12 oz bottle of beer (355 mL), one 5 oz glass of wine (148 mL), or one 1 oz glass of hard liquor (44 mL). General instructions  Schedule regular health, dental, and eye exams.  Stay current with your vaccines.  Tell your health care provider if: ? You often feel depressed. ? You have ever been abused or do not feel safe at home. Summary  Adopting a healthy lifestyle and getting preventive care are important in promoting health and wellness.  Follow your health care provider's instructions about healthy  diet, exercising, and getting tested or screened for diseases.  Follow your health care provider's instructions on monitoring your cholesterol and blood pressure. This information is not intended to replace advice given to you by your health care provider. Make sure you discuss any questions you have with your health care provider. Document Revised: 12/30/2017 Document Reviewed: 12/30/2017 Elsevier Patient Education  2020 Elsevier Inc.  

## 2019-08-25 NOTE — Progress Notes (Signed)
Gynecology Annual Exam   Date of Service: 08/25/2019  PCP: Patient, No Pcp Per  Chief Complaint:  Chief Complaint  Patient presents with  . Annual Exam    History of Present Illness: Patient is a 29 y.o. [id number] presents for annual exam. The patient has no gyn complaints today. She mentions concerns regarding heavy breasts and may consider breast reduction surgery at some time.   LMP: No LMP recorded. (Menstrual status: IUD). Average Interval: regular, 28 days Duration of flow: 2 days Heavy Menses: only light spotting Clots: no Intermenstrual Bleeding: no Postcoital Bleeding: no Dysmenorrhea: no  The patient is sexually active. She currently uses IUD for contraception. She denies dyspareunia.  The patient does perform self breast exams.  There is no notable family history of breast or ovarian cancer in her family.  The patient wears seatbelts: yes.   The patient has regular exercise: she uses a treadmill and is trying to improve her diet. She admits only 4 hours of sleep.  She smokes cigars 1 per week.  The patient denies current symptoms of depression.    Review of Systems: Review of Systems  Constitutional: Negative for chills and fever.  HENT: Negative for congestion, ear discharge, ear pain, hearing loss, sinus pain and sore throat.   Eyes: Negative for blurred vision and double vision.  Respiratory: Negative for cough, shortness of breath and wheezing.   Cardiovascular: Negative for chest pain, palpitations and leg swelling.  Gastrointestinal: Negative for abdominal pain, blood in stool, constipation, diarrhea, heartburn, melena, nausea and vomiting.  Genitourinary: Negative for dysuria, flank pain, frequency, hematuria and urgency.  Musculoskeletal: Negative for back pain, joint pain and myalgias.  Skin: Negative for itching and rash.  Neurological: Negative for dizziness, tingling, tremors, sensory change, speech change, focal weakness, seizures, loss of  consciousness, weakness and headaches.  Endo/Heme/Allergies: Negative for environmental allergies. Does not bruise/bleed easily.  Psychiatric/Behavioral: Negative for depression, hallucinations, memory loss, substance abuse and suicidal ideas. The patient is not nervous/anxious and does not have insomnia.     Past Medical History:  There are no problems to display for this patient.   Past Surgical History:  Past Surgical History:  Procedure Laterality Date  . NO PAST SURGERIES      Gynecologic History:  No LMP recorded. (Menstrual status: IUD). Contraception: IUD Last Pap: 7 years ago Results were: no abnormalities   Obstetric History: [id number]  Family History:  Family History  Problem Relation Age of Onset  . Hypertension Father   . Stroke Father     Social History:  Social History   Socioeconomic History  . Marital status: Single    Spouse name: Not on file  . Number of children: Not on file  . Years of education: Not on file  . Highest education level: Not on file  Occupational History  . Not on file  Tobacco Use  . Smoking status: Current Some Day Smoker    Years: 2.00    Types: Cigars  . Smokeless tobacco: Never Used  . Tobacco comment: 1  day  Vaping Use  . Vaping Use: Never used  Substance and Sexual Activity  . Alcohol use: No  . Drug use: No  . Sexual activity: Yes    Birth control/protection: I.U.D.  Other Topics Concern  . Not on file  Social History Narrative  . Not on file   Social Determinants of Health   Financial Resource Strain:   . Difficulty of Paying Living Expenses:  Food Insecurity:   . Worried About Programme researcher, broadcasting/film/video in the Last Year:   . Barista in the Last Year:   Transportation Needs:   . Freight forwarder (Medical):   Marland Kitchen Lack of Transportation (Non-Medical):   Physical Activity:   . Days of Exercise per Week:   . Minutes of Exercise per Session:   Stress:   . Feeling of Stress :   Social Connections:     . Frequency of Communication with Friends and Family:   . Frequency of Social Gatherings with Friends and Family:   . Attends Religious Services:   . Active Member of Clubs or Organizations:   . Attends Banker Meetings:   Marland Kitchen Marital Status:   Intimate Partner Violence:   . Fear of Current or Ex-Partner:   . Emotionally Abused:   Marland Kitchen Physically Abused:   . Sexually Abused:     Allergies:  No Known Allergies  Medications: Prior to Admission medications   Medication Sig Start Date End Date Taking? Authorizing Provider  levonorgestrel (MIRENA) 20 MCG/24HR IUD 1 each by Intrauterine route once.   Yes [provider]    Physical Exam Vitals: Blood pressure 126/88, height 5\' 6"  (1.676 m), weight 269 lb (122 kg).  General: NAD HEENT: normocephalic, anicteric Thyroid: no enlargement, no palpable nodules Pulmonary: No increased work of breathing, CTAB Cardiovascular: RRR, distal pulses 2+ Breast: Breast symmetrical, no tenderness, no palpable nodules or masses, no skin or nipple retraction present, no nipple discharge.  No axillary or supraclavicular lymphadenopathy. Abdomen: NABS, soft, non-tender, non-distended.  Umbilicus without lesions.  No hepatomegaly, splenomegaly or masses palpable. No evidence of hernia  Genitourinary: exam limited by body habitus  External: Normal external female genitalia.  Normal urethral meatus, normal Bartholin's and Skene's glands.    Vagina: Normal vaginal mucosa, no evidence of prolapse.    Cervix: Grossly normal in appearance, no bleeding, no CMT  Uterus: deferred  Adnexa: deferred  Rectal: deferred  Lymphatic: no evidence of inguinal lymphadenopathy Extremities: no edema, erythema, or tenderness Neurologic: Grossly intact Psychiatric: mood appropriate, affect full   Assessment: 29 y.o. 26 routine annual exam  Plan: Problem List Items Addressed This Visit    None    Visit Diagnoses    Well woman exam with routine  gynecological exam    -  Primary   Relevant Orders   Cytology - PAP   Hepatitis panel, acute   HIV Antibody (routine testing w rflx)   RPR Qual   Screen for sexually transmitted diseases       Relevant Orders   Cytology - PAP   Hepatitis panel, acute   HIV Antibody (routine testing w rflx)   RPR Qual   Cervical cancer screening       Relevant Orders   Cytology - PAP   Encounter for removal of intrauterine contraceptive device (IUD)          1) STI screening  was offered and accepted  2)  ASCCP guidelines and rationale discussed.  Patient opts for every 5 years screening interval  3) Contraception - the patient is currently using  IUD.  She is attempting to conceive in the near future. IUD removal today  4) Routine healthcare maintenance including cholesterol, diabetes screening discussed Declines  5) Return in about 1 year (around 08/24/2020) for annual established gyn.   10/24/2020, CNM [facility] 08/25/2019, 3:22 PM      GYNECOLOGY  OFFICE PROCEDURE NOTE  Christina Riggs is a 29 y.o. [id number] here for IUD removal. The patient currently has a Mirena IUD placed in 2014.  No GYN concerns.  Last pap smear was in 2014 and was normal.  IUD Removal  Patient identified, informed consent performed, consent signed.   Discussed risks of irregular bleeding, cramping, infection, malpositioning or uterine perforation of the IUD which may require further procedures. Time out was performed. Speculum placed in the vagina. The strings of the IUD were grasped and pulled using ring forceps. The IUD was successfully removed in its entirety. Patient tolerated procedure well.    Tresea Mall, CNM [facility] 08/29/2019, 6:29 PM   IUD remval 20100 Modifer 25, plus Modifer 79 is done during a global billing visit

## 2019-08-26 LAB — RPR QUALITATIVE: RPR Ser Ql: NONREACTIVE

## 2019-08-26 LAB — HEPATITIS PANEL, ACUTE
Hep A IgM: NEGATIVE
Hep B C IgM: NEGATIVE
Hep C Virus Ab: <0.1 s/co ratio (ref 0.0–0.9)
Hepatitis B Surface Ag: NEGATIVE

## 2019-08-26 LAB — HIV ANTIBODY (ROUTINE TESTING W REFLEX): HIV Screen 4th Generation wRfx: NONREACTIVE

## 2019-08-30 LAB — CYTOLOGY - PAP
Chlamydia: NEGATIVE
Comment: NEGATIVE
Comment: NEGATIVE
Comment: NEGATIVE
Comment: NORMAL
Diagnosis: UNDETERMINED — AB
High risk HPV: NEGATIVE
Neisseria Gonorrhea: NEGATIVE
Trichomonas: NEGATIVE

## 2019-11-04 ENCOUNTER — Emergency Department: Payer: Worker's Compensation

## 2019-11-04 ENCOUNTER — Emergency Department
Admission: EM | Admit: 2019-11-04 | Discharge: 2019-11-04 | Disposition: A | Discharge status: Home / Self Care | Payer: Worker's Compensation | Attending: Emergency Medicine | Admitting: Emergency Medicine

## 2019-11-04 ENCOUNTER — Other Ambulatory Visit: Payer: Self-pay

## 2019-11-04 ENCOUNTER — Encounter: Payer: Self-pay | Admitting: Emergency Medicine

## 2019-11-04 DIAGNOSIS — S92151A Displaced avulsion fracture (chip fracture) of right talus, initial encounter for closed fracture: Secondary | ICD-10-CM | POA: Diagnosis not present

## 2019-11-04 DIAGNOSIS — W010XXA Fall on same level from slipping, tripping and stumbling without subsequent striking against object, initial encounter: Secondary | ICD-10-CM | POA: Diagnosis not present

## 2019-11-04 DIAGNOSIS — S92031A Displaced avulsion fracture of tuberosity of right calcaneus, initial encounter for closed fracture: Secondary | ICD-10-CM | POA: Diagnosis not present

## 2019-11-04 DIAGNOSIS — Y990 Civilian activity done for income or pay: Secondary | ICD-10-CM | POA: Diagnosis not present

## 2019-11-04 DIAGNOSIS — S93401A Sprain of unspecified ligament of right ankle, initial encounter: Secondary | ICD-10-CM | POA: Diagnosis not present

## 2019-11-04 DIAGNOSIS — S99911A Unspecified injury of right ankle, initial encounter: Secondary | ICD-10-CM | POA: Diagnosis present

## 2019-11-04 DIAGNOSIS — Z87891 Personal history of nicotine dependence: Secondary | ICD-10-CM | POA: Diagnosis not present

## 2019-11-04 MED ORDER — HYDROCODONE-ACETAMINOPHEN 5-325 MG PO TABS: 1.0000 | ORAL_TABLET | Freq: Three times a day (TID) | ORAL | 0 refills | Status: AC | PRN

## 2019-11-04 MED ORDER — ONDANSETRON 4 MG PO TBDP
4.0000 mg | ORAL_TABLET | Freq: Once | ORAL | Status: AC
  Administered 2019-11-04: 4 mg via ORAL
  Filled 2019-11-04: qty 1

## 2019-11-04 MED ORDER — HYDROCODONE-ACETAMINOPHEN 5-325 MG PO TABS
1.0000 | ORAL_TABLET | Freq: Once | ORAL | Status: AC
  Administered 2019-11-04: 1 via ORAL
  Filled 2019-11-04: qty 1

## 2019-11-04 MED ORDER — NAPROXEN 500 MG PO TABS: 500.0000 mg | ORAL_TABLET | Freq: Two times a day (BID) | ORAL | 0 refills | Status: AC

## 2019-11-04 NOTE — ED Notes (Signed)
Workers comp completed by ED tech at this time.

## 2019-11-04 NOTE — ED Triage Notes (Signed)
Patient states that she fell at work Wednesday. Patient with complaint of pain to right ankle and abrasions to right knee.

## 2019-11-04 NOTE — Discharge Instructions (Signed)
You have an avulsion fracture of the ankle and heel. Wear the walking boot until you are cleared by Ortho-Podiatry. Take the prescription meds as directed. Follow-up with your company's approved W/C provider. Apply ice to reduce swelling and pain. Rest with the foot elevated when seated.

## 2019-11-04 NOTE — ED Notes (Signed)
Pt fell at work on Wednesday and c/o generalized body aches and right ankle pain. Pt requests worker's comp.

## 2019-11-04 NOTE — ED Provider Notes (Signed)
[facility] Emergency Department Provider Note ____________________________________________  Time seen: 2053  I have reviewed the triage vital signs and the nursing notes.  HISTORY  Chief Complaint  Fall  HPI Christina Riggs is a 29 y.o. female presents to the ED for evaluation of a mechanical fall from Wednesday.  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  She describes a twisting injury where her foot was hyper flexed, and she experienced immediate pain to the dorsal aspect of her foot and ankle.  She complains of right ankle and foot pain with disability and abrasion to the right knee.  She denies any head injury or loss of consciousness.  History reviewed. No pertinent past medical history.  There are no problems to display for this patient.   Past Surgical History:  Procedure Laterality Date  . NO PAST SURGERIES      Prior to Admission medications   Medication Sig Start Date End Date Taking? Authorizing Provider  HYDROcodone-acetaminophen (NORCO) 5-325 MG tablet Take 1 tablet by mouth 3 (three) times daily as needed for up to 3 days. 11/04/19 11/07/19  Baylin Gamblin, Charlesetta Ivory, [name]  levonorgestrel (MIRENA) 20 MCG/24HR IUD 1 each by Intrauterine route once.    [provider]  naproxen (NAPROSYN) 500 MG tablet Take 1 tablet (500 mg total) by mouth 2 (two) times daily with a meal for 15 days. 11/04/19 11/19/19  Jayjay Littles, Charlesetta Ivory, [name]    Allergies Patient has no known allergies.  Family History  Problem Relation Age of Onset  . Hypertension Father   . Stroke Father     Social History Social History   Tobacco Use  . Smoking status: Former Smoker    Years: 2.00    Types: Cigars  . Smokeless tobacco: Never Used  . Tobacco comment: 1  day  Vaping Use  . Vaping Use: Never used  Substance Use Topics  . Alcohol use: Yes     Comment: occ  . Drug use: No    Review of Systems  Constitutional: Negative for fever. Eyes: Negative for visual changes. ENT: Negative for sore throat. Cardiovascular: Negative for chest pain. Respiratory: Negative for shortness of breath. Gastrointestinal: Negative for abdominal pain, vomiting and diarrhea. Genitourinary: Negative for dysuria. Musculoskeletal: Negative for back pain.  Right ankle/foot pain as above. Skin: Negative for rash.  Right knee abrasion is noted. Neurological: Negative for headaches, focal weakness or numbness. ____________________________________________  PHYSICAL EXAM:  VITAL SIGNS: ED Triage Vitals  Enc Vitals Group     BP 11/04/19 1941 (!) 149/91     Pulse Rate 11/04/19 1941 99     Resp 11/04/19 1941 18     Temp 11/04/19 1941 99.5 F (37.5 C)     Temp Source 11/04/19 1941 Oral     SpO2 11/04/19 1941 100 %     Weight 11/04/19 1937 230 lb (104.3 kg)     Height 11/04/19 1937 5\' 4"  (1.626 m)     Head Circumference --      Peak Flow --      Pain Score 11/04/19 1937 10     Pain Loc --      Pain Edu? --      Excl. in GC? --     Constitutional: Alert and oriented. Well appearing and in no distress. Head: Normocephalic and atraumatic. Eyes: Conjunctivae are normal. Normal extraocular  movements Cardiovascular: Normal rate, regular rhythm. Normal distal pulses. Respiratory: Normal respiratory effort. No wheezes/rales/rhonchi. Gastrointestinal: Soft and nontender. No distention. Musculoskeletal: Right foot without obvious deformity or dislocation.  Patient with some subtle soft tissue swelling noted.  She is tender to palpation over the dorsal aspect of the foot at the talus, as well as the lateral aspect of the foot at the malleolus.  No calf or Achilles tenderness is elicited.  Nontender with normal range of motion in all extremities.  Neurologic:  Normal gross sensation.  Normal speech and language. No gross focal neurologic deficits are  appreciated. Skin:  Skin is warm, dry and intact. No rash noted. ____________________________________________   RADIOLOGY  DG Right Ankle  IMPRESSION: 1. Probable subtle avulsion fracture arising from the lateral calcaneus. 2. Avulsion fracture arising from the dorsal anterior talus. 3. Extensive soft tissue swelling about the ankle. ____________________________________________  PROCEDURES  Zofran 4 mg ODT Hydrocodone 5-325 mg p.o. CAM boot Crutches  Procedures ____________________________________________  INITIAL IMPRESSION / ASSESSMENT AND PLAN / ED COURSE  Patient with initial fracture management of a closed avulsion fracture of the talus and lateral calcaneus.  She is placed in a cam walker and given crutches to ambulate with weightbearing as tolerated.  She will follow-up with podiatry or the provider approved by her workers Designer, industrial/product.  She will take the prescription naproxen and hydrocodone as directed.  She is to rest with the foot elevated and apply ice to reduce swelling.  Work note is provided allowing the patient to work as tolerated with the Cam walker in place.  Return precautions have been discussed.  Christina Riggs was evaluated in Emergency Department on 11/04/2019 for the symptoms described in the history of present illness. She was evaluated in the context of the global COVID-19 pandemic, which necessitated consideration that the patient might be at risk for infection with the SARS-CoV-2 virus that causes COVID-19. Institutional protocols and algorithms that pertain to the evaluation of patients at risk for COVID-19 are in a state of rapid change based on information released by regulatory bodies including the CDC and federal and state organizations. These policies and algorithms were followed during the patient's care in the ED.  I reviewed the patient's prescription history over the last 12 months in the multi-state controlled substances database(s) that includes  [place], [place], [place], [place], [place], [place], [place], [place], [place], [place], [place], [place], [place], and [place].  Results were notable for no narcotic history. ____________________________________________  FINAL CLINICAL IMPRESSION(S) / ED DIAGNOSES  Final diagnoses:  Closed displaced avulsion fracture of right talus, initial encounter  Closed displaced avulsion fracture of tuberosity of right calcaneus, initial encounter  Sprain of right ankle, unspecified ligament, initial encounter      Lissa Hoard, [name] 11/04/19 2205    Phineas Semen, MD 11/04/19 2219

## 2019-12-06 ENCOUNTER — Other Ambulatory Visit: Payer: Self-pay

## 2019-12-06 ENCOUNTER — Ambulatory Visit (LOCAL_COMMUNITY_HEALTH_CENTER): Payer: Medicaid Other

## 2019-12-06 VITALS — BP 138/90 | Ht 64.0 in | Wt 259.0 lb

## 2019-12-06 DIAGNOSIS — Z3201 Encounter for pregnancy test, result positive: Secondary | ICD-10-CM

## 2019-12-06 LAB — PREGNANCY, URINE: Preg Test, Ur: POSITIVE — AB

## 2019-12-06 MED ORDER — PRENATAL 27-0.8 MG PO TABS: 1.0000 | ORAL_TABLET | Freq: Every day | ORAL | 0 refills | Status: AC

## 2019-12-06 NOTE — Progress Notes (Signed)
UPT positive today. Plans prenatal care at [facility]. Reports "spotting", pink vaginal discharge 2 days ago. Consult K. Alvester Morin, MD who advises that this can be normal in early pregnancy, however she should have it assessed/ seek immediate medical attn.  if bleeding increases and/or accompanied with pain. Recommends pt to establish prenatal care ASAP. RN counseled pt on provider recommendations. Pt in agreement. Questions answered and reports understanding. Pt to DSS for Medicaid/pregnant women.Jerel Shepherd, RN

## 2019-12-06 NOTE — Progress Notes (Signed)
Attestation: I agree with the advice given to this patient by our nurse staff.  I have reviewed the RN's note and chart. Documentation reflects my recommendations which were given verbally to the nurse at the point of care.   Jeronica Stlouis Niles Keylen Uzelac, MD, MPH, ABFM Medical Director  [facility]  

## 2019-12-27 ENCOUNTER — Encounter: Payer: Medicaid Other | Admitting: Advanced Practice Midwife

## 2019-12-27 ENCOUNTER — Other Ambulatory Visit: Payer: Self-pay

## 2020-01-19 ENCOUNTER — Emergency Department
Admission: EM | Admit: 2020-01-19 | Discharge: 2020-01-19 | Disposition: A | Discharge status: Home / Self Care | Payer: HRSA Program | Attending: Emergency Medicine | Admitting: Emergency Medicine

## 2020-01-19 ENCOUNTER — Other Ambulatory Visit: Payer: Self-pay

## 2020-01-19 ENCOUNTER — Encounter: Payer: Self-pay | Admitting: Emergency Medicine

## 2020-01-19 DIAGNOSIS — Z87891 Personal history of nicotine dependence: Secondary | ICD-10-CM | POA: Diagnosis not present

## 2020-01-19 DIAGNOSIS — R059 Cough, unspecified: Secondary | ICD-10-CM | POA: Diagnosis present

## 2020-01-19 DIAGNOSIS — U071 COVID-19: Secondary | ICD-10-CM

## 2020-01-19 LAB — RESP PANEL BY RT-PCR (FLU A&B, COVID) ARPGX2
Influenza A by PCR: NEGATIVE
Influenza B by PCR: NEGATIVE
SARS Coronavirus 2 by RT PCR: POSITIVE — AB

## 2020-01-19 NOTE — ED Provider Notes (Signed)
[facility] Emergency Department Provider Note  ____________________________________________  Time seen: Approximately 1:28 PM  I have reviewed the triage vital signs and the nursing notes.   HISTORY  Chief Complaint No chief complaint on file.    HPI Christina Riggs is a 29 y.o. female that presents to the emergency department for evaluation of nasal congestion, nonproductive cough, body aches starting last night.  Patient is [redacted] weeks pregnant.  This is her third pregnancy.  She just got her Medicaid approved and will be starting OB care with [facility].  She has been on prenatal vitamins.  She has not had a Covid vaccine.  She denies any shortness of breath, chest pain, vomiting, abdominal pain, vaginal discharge or bleeding.  Past Medical History:  Diagnosis Date  . Fracture of foot    Right foot fracture 10/2019  . Hypertension    per pt report    There are no problems to display for this patient.   Past Surgical History:  Procedure Laterality Date  . NO PAST SURGERIES      Prior to Admission medications   Medication Sig Start Date End Date Taking? Authorizing Provider  levonorgestrel (MIRENA) 20 MCG/24HR IUD 1 each by Intrauterine route once. Patient not taking: Reported on 12/06/2019    [provider]  Prenatal Vit-Fe Fumarate-FA (MULTIVITAMIN-PRENATAL) 27-0.8 MG TABS tablet Take 1 tablet by mouth daily at 12 noon. 12/06/19 03/15/20  Federico Flake, MD    Allergies Patient has no known allergies.  Family History  Problem Relation Age of Onset  . Hypertension Father   . Stroke Father     Social History Social History   Tobacco Use  . Smoking status: Former Smoker    Years: 2.00    Types: Cigars  . Smokeless tobacco: Never Used  . Tobacco comment: 1  day  Vaping Use  . Vaping Use: Never used  Substance Use Topics  . Alcohol use: Not Currently    Comment: last use 07/2019  . Drug use: No     Review of Systems   Constitutional: No fever/chills Eyes: No visual changes. No discharge. ENT: Positive for congestion and rhinorrhea. Cardiovascular: No chest pain. Respiratory: Positive for cough. No SOB. Gastrointestinal: No abdominal pain.  No nausea, no vomiting.  No diarrhea.  No constipation. Musculoskeletal: Positive for body aches. Skin: Negative for rash, abrasions, lacerations, ecchymosis. Neurological: Negative for headaches.   ____________________________________________   PHYSICAL EXAM:  VITAL SIGNS: ED Triage Vitals [01/19/20 1145]  Enc Vitals Group     BP (!) 144/93     Pulse Rate 98     Resp 18     Temp 98.5 F (36.9 C)     Temp Source Oral     SpO2 100 %     Weight 238 lb (108 kg)     Height 5\' 4"  (1.626 m)     Head Circumference      Peak Flow      Pain Score 0     Pain Loc      Pain Edu?      Excl. in GC?      Constitutional: Alert and oriented. Well appearing and in no acute distress. Eyes: Conjunctivae are normal. PERRL. EOMI. No discharge. Head: Atraumatic. ENT: No frontal and maxillary sinus tenderness.      Ears: Tympanic membranes pearly gray with good landmarks. No discharge.      Nose: Mild congestion/rhinnorhea.      Mouth/Throat: Mucous membranes are  moist. Oropharynx non-erythematous. Tonsils not enlarged. No exudates. Uvula midline. Neck: No stridor.   Hematological/Lymphatic/Immunilogical: No cervical lymphadenopathy. Cardiovascular: Normal rate, regular rhythm.  Good peripheral circulation. Respiratory: Normal respiratory effort without tachypnea or retractions. Lungs CTAB. Good air entry to the bases with no decreased or absent breath sounds. Gastrointestinal: Bowel sounds 4 quadrants. Soft and nontender to palpation. No guarding or rigidity. No palpable masses. No distention. Musculoskeletal: Full range of motion to all extremities. No gross deformities appreciated. Neurologic:  Normal speech and language. No gross focal neurologic deficits are  appreciated.  Skin:  Skin is warm, dry and intact. No rash noted. Psychiatric: Mood and affect are normal. Speech and behavior are normal. Patient exhibits appropriate insight and judgement.   ____________________________________________   LABS (all labs ordered are listed, but only abnormal results are displayed)  Labs Reviewed  RESP PANEL BY RT-PCR (FLU A&B, COVID) ARPGX2 - Abnormal; Notable for the following components:      Result Value   SARS Coronavirus 2 by RT PCR POSITIVE (*)    All other components within normal limits   ____________________________________________  EKG   ____________________________________________  RADIOLOGY   No results found.  ____________________________________________    PROCEDURES  Procedure(s) performed:    Procedures    Medications - No data to display   ____________________________________________   INITIAL IMPRESSION / ASSESSMENT AND PLAN / ED COURSE  Pertinent labs & imaging results that were available during my care of the patient were reviewed by me and considered in my medical decision making (see chart for details).  Review of the [place] CSRS was performed in accordance of the [facility] prior to dispensing any controlled drugs.     Patient's diagnosis is consistent with Covid 19. Vital signs and exam are reassuring. Covid 19 test is positive. Education about Covid was provided. Patient appears well and is staying well hydrated. Patient feels comfortable going home.  Patient is to follow up with Ob and PCP as needed or otherwise directed. Patient is given ED precautions to return to the ED for any worsening or new symptoms.  Christina Riggs was evaluated in Emergency Department on 01/19/2020 for the symptoms described in the history of present illness. She was evaluated in the context of the global COVID-19 pandemic, which necessitated consideration that the patient might be at risk for infection with the SARS-CoV-2 virus that  causes COVID-19. Institutional protocols and algorithms that pertain to the evaluation of patients at risk for COVID-19 are in a state of rapid change based on information released by regulatory bodies including the CDC and federal and state organizations. These policies and algorithms were followed during the patient's care in the ED.   ____________________________________________  FINAL CLINICAL IMPRESSION(S) / ED DIAGNOSES  Final diagnoses:  COVID-19      NEW MEDICATIONS STARTED DURING THIS VISIT:  ED Discharge Orders    None          This chart was dictated using voice recognition software/Dragon. Despite best efforts to proofread, errors can occur which can change the meaning. Any change was purely unintentional.    Enid Derry, [name] 01/19/20 1730    Minna Antis, MD 01/21/20 [phone]

## 2020-01-19 NOTE — ED Triage Notes (Addendum)
Pt arrived to ED with c/o runny nose, cough, and body aches starting yesterday. Pt states she wants a COVID test. Pt NAD at this time  Pt states family at home has been dx with the flu.   Pt states she is [redacted] weeks pregnant. Pt states no issues with the pregnancy.

## 2020-01-21 NOTE — L&D Delivery Note (Addendum)
Delivery Note At 1047 a viable female was delivered via  vaginal.(Presentation:    OA with restitution to ROT  ). Called to the room due to some repetitive decels with contractions. A quick VE revealed complete dilation and the vertex at +2 station. Easy and rapid delivery of the fetal head with the shoulders presenting immediately afterwards. Spontaneous respirations noted and excellent tone.The baby was placed briefly on the maternal abdomen for drying and stimulation. The cord was clamped after one minute and as the NICU team arrived quickly, the baby was handed off to the Neonatologist team.  APGAR:pending , ; weight  . 1810 gms, 3lbs 15.8 oz.  Placenta status:  ,intact, delivered at 1052  .  Cord:  3 vessel with the following complications:  preterm delivery after PPROM. Postpartum bleed (650cc blood loss) Cord pH: NA  Anesthesia:  epidural Episiotomy:  none Lacerations:  none Suture Repair: NA Est. Blood Loss (mL):  650   Mom to postpartum.  Baby to NICU.  [name] 05/05/2020, 11:39 AM

## 2020-02-02 ENCOUNTER — Encounter: Payer: Self-pay | Admitting: Advanced Practice Midwife

## 2020-02-02 ENCOUNTER — Ambulatory Visit (INDEPENDENT_AMBULATORY_CARE_PROVIDER_SITE_OTHER): Payer: Medicaid Other | Admitting: Advanced Practice Midwife

## 2020-02-02 ENCOUNTER — Other Ambulatory Visit: Payer: Self-pay

## 2020-02-02 ENCOUNTER — Other Ambulatory Visit (HOSPITAL_COMMUNITY)
Admission: RE | Admit: 2020-02-02 | Discharge: 2020-02-02 | Disposition: A | Discharge status: Home / Self Care | Payer: Medicaid Other | Source: Ambulatory Visit | Attending: Advanced Practice Midwife | Admitting: Advanced Practice Midwife

## 2020-02-02 VITALS — BP 128/72 | Ht 64.0 in | Wt 259.4 lb

## 2020-02-02 DIAGNOSIS — O0991 Supervision of high risk pregnancy, unspecified, first trimester: Secondary | ICD-10-CM | POA: Diagnosis present

## 2020-02-02 DIAGNOSIS — Z1379 Encounter for other screening for genetic and chromosomal anomalies: Secondary | ICD-10-CM

## 2020-02-02 DIAGNOSIS — Z113 Encounter for screening for infections with a predominantly sexual mode of transmission: Secondary | ICD-10-CM

## 2020-02-02 DIAGNOSIS — O99211 Obesity complicating pregnancy, first trimester: Secondary | ICD-10-CM | POA: Insufficient documentation

## 2020-02-02 DIAGNOSIS — Z369 Encounter for antenatal screening, unspecified: Secondary | ICD-10-CM

## 2020-02-02 DIAGNOSIS — Z3A12 12 weeks gestation of pregnancy: Secondary | ICD-10-CM

## 2020-02-02 LAB — POCT URINALYSIS DIPSTICK OB
Glucose, UA: NEGATIVE
POC,PROTEIN,UA: NEGATIVE

## 2020-02-02 NOTE — Patient Instructions (Signed)

## 2020-02-02 NOTE — Progress Notes (Signed)
NOB 

## 2020-02-02 NOTE — Progress Notes (Signed)
New Obstetric Patient H&P    Chief Complaint: "Desires prenatal care"   History of Present Illness: Patient is a 30 y.o. [id number] Not Hispanic or Latino female, presents with amenorrhea and positive home pregnancy test. Patient's last menstrual period was 11/04/2019. and based on her  LMP, her EDD is Estimated Date of Delivery: 08/10/20 and her EGA is [redacted]w[redacted]d. Cycles are 3-4 days, regular, and occur approximately every : 28 days. Her last pap smear was 5 months ago and was ASCUS with NEGATIVE high risk HPV.    She had a urine pregnancy test which was positive 2 month(s)  ago. Her last menstrual period was normal and lasted for  3 or 4 day(s). Since her LMP she claims she has experienced breast tenderness, fatigue, nausea, vomiting. She denies vaginal bleeding. Her past medical history is noncontributory. Her prior pregnancies are notable for G1 2012 FT SVD 7#8oz female, G2 59 FT SVD 7#6oz female. She reports HTN with both pregnancies not on medication.  Since her LMP, she admits to the use of tobacco products  no She claims she has lost a few pounds since the start of her pregnancy.  There are cats in the home in the home  no  She admits close contact with children on a regular basis  yes  She has had chicken pox in the past yes She has had Tuberculosis exposures, symptoms, or previously tested positive for TB   no Current or past history of domestic violence. no  Genetic Screening/Teratology Counseling: (Includes patient, baby's father, or anyone in either family with:)   1. Patient's age >/= 33 at [facility]  no 2. Thalassemia ([place], [place], Mediterranean, or Asian background): MCV<80  no 3. Neural tube defect (meningomyelocele, spina bifida, anencephaly)  no 4. Congenital heart defect  no  5. Down syndrome  no 6. Tay-Sachs (Jewish, [place]  no 7. Canavan's Disease  no 8. Sickle cell disease or trait (African)  no  9. Hemophilia or other blood disorders  no  10. Muscular dystrophy   no  11. Cystic fibrosis  no  12. Huntington's Chorea  no  13. Mental retardation/autism  no 14. Other inherited genetic or chromosomal disorder  no 15. Maternal metabolic disorder (DM, PKU, etc)  no 16. Patient or FOB with a child with a birth defect not listed above no  16a. Patient or FOB with a birth defect themselves no 17. Recurrent pregnancy loss, or stillbirth  no  18. Any medications since LMP other than prenatal vitamins (include vitamins, supplements, OTC meds, drugs, alcohol)  no 19. Any other genetic/environmental exposure to discuss  no  Infection History:   1. Lives with someone with TB or TB exposed  no  2. Patient or partner has history of genital herpes  no 3. Rash or viral illness since LMP  no 4. History of STI (GC, CT, HPV, syphilis, HIV)  no 5. History of recent travel :  no  Other pertinent information:  no     Review of Systems:10 point review of systems negative unless otherwise noted in HPI  Past Medical History:  Patient Active Problem List   Diagnosis Date Noted  . Supervision of high risk pregnancy in first trimester 02/02/2020    Clinic [facility] Prenatal Labs  Dating  Blood type:     Genetic Screen 1 Screen:    AFP:     Quad:     NIPS: Antibody:   Anatomic [place]  Rubella:    Varicella: @VZVIGG @  GTT Early:               Third trimester:  RPR: Non Reactive (08/05 1527)   Rhogam  HBsAg: Negative (08/05 1527)   Vaccines TDAP:                       Flu Shot: Covid: HIV: Non Reactive (08/05 1527)   Baby Food Breast                               GBS:   GC/CT:  Contraception  Pap:  CBB     CS/VBAC NA   Support Person Devanta       . Obesity affecting pregnancy in first trimester 02/02/2020    Past Surgical History:  Past Surgical History:  Procedure Laterality Date  . NO PAST SURGERIES      Gynecologic History: Patient's last menstrual period was 11/04/2019.  Obstetric History: [id number]  Family History:  Family History  Problem  Relation Age of Onset  . Hypertension Father   . Stroke Father     Social History:  Social History   Socioeconomic History  . Marital status: Single    Spouse name: Not on file  . Number of children: Not on file  . Years of education: Not on file  . Highest education level: Not on file  Occupational History  . Not on file  Tobacco Use  . Smoking status: Former Smoker    Years: 2.00    Types: Cigars  . Smokeless tobacco: Never Used  . Tobacco comment: 1  day  Vaping Use  . Vaping Use: Never used  Substance and Sexual Activity  . Alcohol use: Not Currently    Comment: last use 07/2019  . Drug use: No  . Sexual activity: Yes    Comment: no bcm since IUD removed 08/2019  Other Topics Concern  . Not on file  Social History Narrative  . Not on file   Social Determinants of Health   Financial Resource Strain: Not on file  Food Insecurity: Not on file  Transportation Needs: Not on file  Physical Activity: Not on file  Stress: Not on file  Social Connections: Not on file  Intimate Partner Violence: Unknown  . Fear of Current or Ex-Partner: Not on file  . Emotionally Abused: No  . Physically Abused: No  . Sexually Abused: No    Allergies:  No Known Allergies  Medications: Prior to Admission medications   Medication Sig Start Date End Date Taking? Authorizing Provider  Prenatal Vit-Fe Fumarate-FA (MULTIVITAMIN-PRENATAL) 27-0.8 MG TABS tablet Take 1 tablet by mouth daily at 12 noon. 12/06/19 03/15/20 Yes Federico Flake, MD    Physical Exam Vitals: Blood pressure 128/72, height 5\' 4"  (1.626 m), weight 259 lb 6.4 oz (117.7 kg), last menstrual period 11/04/2019.  General: NAD HEENT: normocephalic, anicteric Thyroid: no enlargement, no palpable nodules Pulmonary: No increased work of breathing, CTAB Cardiovascular: RRR, distal pulses 2+ Abdomen: NABS, soft, non-tender, non-distended.  Umbilicus without lesions.  No hepatomegaly, splenomegaly or masses palpable.  No evidence of hernia. FHTs 150s with doppler Genitourinary:  External: Normal external female genitalia.  Normal urethral meatus, normal Bartholin's and Skene's glands.    Vagina: Normal vaginal mucosa, no evidence of prolapse.    Cervix: not evaluated, aptima swab collected  Uterus: deferred limited by body habitus    Adnexa: deferred limited by body habitus  Rectal:  deferred Extremities: no edema, erythema, or tenderness Neurologic: Grossly intact Psychiatric: mood appropriate, affect full   The following were addressed during this visit:  Breastfeeding Education - Early initiation of breastfeeding    Comments: Keeps milk supply adequate, helps contract uterus and slow bleeding, and early milk is the perfect first food and is easy to digest.   - The importance of exclusive breastfeeding    Comments: Provides antibodies, Lower risk of breast and ovarian cancers, and type-2 diabetes,Helps your body recover, Reduced chance of SIDS.   - Risks of giving your baby anything other than breast milk if you are breastfeeding    Comments: Make the baby less content with breastfeeds, may make my baby more susceptible to illness, and may reduce my milk supply.   - The importance of early skin-to-skin contact    Comments: Keeps baby warm and secure, helps keep baby's blood sugar up and breathing steady, easier to bond and breastfeed, and helps calm baby.  - Rooming-in on a 24-hour basis    Comments: Easier to learn baby's feeding cues, easier to bond and get to know each other, and encourages milk production.   - Feeding on demand or baby-led feeding    Comments: Helps prevent breastfeeding complications, helps bring in good milk supply, prevents under or overfeeding, and helps baby feel content and satisfied   - Frequent feeding to help assure optimal milk production    Comments: Making a full supply of milk requires frequent removal of milk from breasts, infant will eat 8-12 times in 24  hours, if separated from infant use breast massage, hand expression and/ or pumping to remove milk from breasts.   - Effective positioning and attachment    Comments: Helps my baby to get enough breast milk, helps to produce an adequate milk supply, and helps prevent nipple pain and damage   - Exclusive breastfeeding for the first 6 months    Comments: Builds a healthy milk supply and keeps it up, protects baby from sickness and disease, and breastmilk has everything your baby needs for the first 6 months.  - Individualized Education    Comments: Contraindications to breastfeeding and other special medical conditions Patient has experience with breastfeeding   Assessment: 30 y.o. [id number] at [redacted]w[redacted]d presenting to initiate prenatal care  Plan: 1) Avoid alcoholic beverages. 2) Patient encouraged not to smoke.  3) Discontinue the use of all non-medicinal drugs and chemicals.  4) Take prenatal vitamins daily.  5) Nutrition, food safety (fish, cheese advisories, and high nitrite foods) and exercise discussed. 6) Hospital and practice style discussed with cross coverage system.  7) Genetic Screening, such as with 1st Trimester Screening, cell free fetal DNA, AFP testing, and Ultrasound, as well as with amniocentesis and CVS as appropriate, is discussed with patient. At the conclusion of today's visit patient requested genetic testing 8) Patient is asked about travel to areas at risk for the Zika virus, and counseled to avoid travel and exposure to mosquitoes or sexual partners who may have themselves been exposed to the virus. Testing is discussed, and will be ordered as appropriate.  9) Aptima, urine culture today 10) Return to clinic in 1 week for dating scan, early 1 hr gtt, NOB panel, sickle cell screen, MaterniT 21   Tresea Mall, CNM [facility] 02/02/2020, 10:46 AM

## 2020-02-03 LAB — CERVICOVAGINAL ANCILLARY ONLY
Chlamydia: NEGATIVE
Comment: NEGATIVE
Comment: NEGATIVE
Comment: NORMAL
Neisseria Gonorrhea: NEGATIVE
Trichomonas: NEGATIVE

## 2020-02-04 LAB — URINE CULTURE: Organism ID, Bacteria: NO GROWTH

## 2020-02-13 ENCOUNTER — Encounter: Payer: Self-pay | Admitting: Advanced Practice Midwife

## 2020-02-13 ENCOUNTER — Other Ambulatory Visit: Payer: Self-pay | Admitting: Advanced Practice Midwife

## 2020-02-13 ENCOUNTER — Ambulatory Visit (INDEPENDENT_AMBULATORY_CARE_PROVIDER_SITE_OTHER): Payer: Medicaid Other | Admitting: Advanced Practice Midwife

## 2020-02-13 ENCOUNTER — Ambulatory Visit (INDEPENDENT_AMBULATORY_CARE_PROVIDER_SITE_OTHER): Payer: Medicaid Other

## 2020-02-13 ENCOUNTER — Other Ambulatory Visit: Payer: Self-pay

## 2020-02-13 ENCOUNTER — Other Ambulatory Visit: Payer: Medicaid Other

## 2020-02-13 VITALS — BP 120/72 | Ht 64.0 in | Wt 258.6 lb

## 2020-02-13 DIAGNOSIS — Z1379 Encounter for other screening for genetic and chromosomal anomalies: Secondary | ICD-10-CM

## 2020-02-13 DIAGNOSIS — O0991 Supervision of high risk pregnancy, unspecified, first trimester: Secondary | ICD-10-CM

## 2020-02-13 DIAGNOSIS — Z3A18 18 weeks gestation of pregnancy: Secondary | ICD-10-CM

## 2020-02-13 DIAGNOSIS — O99212 Obesity complicating pregnancy, second trimester: Secondary | ICD-10-CM

## 2020-02-13 DIAGNOSIS — O0992 Supervision of high risk pregnancy, unspecified, second trimester: Secondary | ICD-10-CM

## 2020-02-13 DIAGNOSIS — O99211 Obesity complicating pregnancy, first trimester: Secondary | ICD-10-CM

## 2020-02-13 DIAGNOSIS — Z113 Encounter for screening for infections with a predominantly sexual mode of transmission: Secondary | ICD-10-CM

## 2020-02-13 LAB — POCT URINALYSIS DIPSTICK OB
Glucose, UA: NEGATIVE
POC,PROTEIN,UA: NEGATIVE

## 2020-02-13 LAB — OB RESULTS CONSOLE VARICELLA ZOSTER ANTIBODY, IGG: Varicella: IMMUNE

## 2020-02-13 NOTE — Progress Notes (Signed)
  Routine Prenatal Care Visit  Subjective  Christina Riggs is a 30 y.o. G3P2002 at Unknown being seen today for ongoing prenatal care.  She is currently monitored for the following issues for this high-risk pregnancy and has Supervision of high risk pregnancy in first trimester and Obesity affecting pregnancy in first trimester on their problem list.  ----------------------------------------------------------------------------------- Patient reports no complaints.    . Vag. Bleeding: None.   . Leaking Fluid denies.  ----------------------------------------------------------------------------------- The following portions of the patient's history were reviewed and updated as appropriate: allergies, current medications, past family history, past medical history, past social history, past surgical history and problem list. Problem list updated.  Objective  Blood pressure 120/72, height 5\' 4"  (1.626 m), weight 258 lb 9.6 oz (117.3 kg), last menstrual period 11/04/2019. Pregravid weight 265 lb (120.2 kg) Total Weight Gain -6 lb 6.4 oz (-2.903 kg) Urinalysis: Urine Protein Negative  Urine Glucose Negative  Fetal Status: Fetal Heart Rate (bpm): 166      Presentation: Vertex   Anatomy/dating: complete, normal, cephalic, gender surprise, placenta anterior, EDD adjusted for 4 week difference.  General:  Alert, oriented and cooperative. Patient is in no acute distress.  Skin: Skin is warm and dry. No rash noted.   Cardiovascular: Normal heart rate noted  Respiratory: Normal respiratory effort, no problems with respiration noted  Abdomen: Soft, gravid, appropriate for gestational age.       Pelvic:  Cervical exam deferred        Extremities: Normal range of motion.     Mental Status: Normal mood and affect. Normal behavior. Normal judgment and thought content.   Assessment   29 y.o. 11/06/2019 at Unknown by  Not found. presenting for routine prenatal visit  Plan   pregnancy 3 Problems (from  02/02/20 to present)    Problem Noted Resolved   Supervision of high risk pregnancy in first trimester 02/02/2020 by 02/04/2020, CNM No   Overview Signed 02/02/2020 10:37 AM by 02/04/2020, CNM    Clinic [facility] Prenatal Labs  Dating By 18w u/s Blood type:     Genetic Screen 1 Screen:    AFP:     Quad:     NIPS: Antibody:   Anatomic [place]  Rubella:    Varicella: @VZVIGG @  GTT Early:               Third trimester:  RPR: Non Reactive (08/05 1527)   Rhogam  HBsAg: Negative (08/05 1527)   Vaccines TDAP:                       Flu Shot: Covid: HIV: Non Reactive (08/05 1527)   Baby Food Breast                               GBS:   GC/CT:  Contraception  Pap:  CBB     CS/VBAC NA   Support Person Devanta              Preterm labor symptoms and general obstetric precautions including but not limited to vaginal bleeding, contractions, leaking of fluid and fetal movement were reviewed in detail with the patient.    Return in about 4 weeks (around 03/12/2020) for rob.  02-12-1985, CNM 02/13/2020 11:36 AM

## 2020-02-13 NOTE — Progress Notes (Signed)
1 HR GTT ROB

## 2020-02-15 LAB — RPR+RH+ABO+RUB AB+AB SCR+CB...
HIV Screen 4th Generation wRfx: NONREACTIVE
Hematocrit: 35.6 % (ref 34.0–46.6)
Hemoglobin: 11.9 g/dL (ref 11.1–15.9)
Hepatitis B Surface Ag: NEGATIVE
MCH: 30.4 pg (ref 26.6–33.0)
MCHC: 33.4 g/dL (ref 31.5–35.7)
MCV: 91 fL (ref 79–97)
Platelets: 318 10*3/uL (ref 150–450)
RBC: 3.92 x10E6/uL (ref 3.77–5.28)
RDW: 12.4 % (ref 11.7–15.4)
RPR Ser Ql: NONREACTIVE
Rh Factor: POSITIVE
Rubella Antibodies, IGG: 1.84 index (ref >0.99)
Varicella zoster IgG: 192 index (ref >165)
WBC: 5 10*3/uL (ref 3.4–10.8)

## 2020-02-15 LAB — HGB FRACTIONATION CASCADE
Hgb A2: 3 % (ref 1.8–3.2)
Hgb A: 97 % (ref 96.4–98.8)
Hgb F: 0 % (ref 0.0–2.0)
Hgb S: 0 %

## 2020-02-15 LAB — GLUCOSE, 1 HOUR GESTATIONAL: Gestational Diabetes Screen: 122 mg/dL (ref 65–139)

## 2020-02-15 LAB — AB SCR+ANTIBODY ID: Antibody Screen: POSITIVE — AB

## 2020-02-17 LAB — MATERNIT21 PLUS CORE+SCA
Fetal Fraction: 5
Monosomy X (Turner Syndrome): NOT DETECTED
Result (T21): NEGATIVE
Trisomy 13 (Patau syndrome): NEGATIVE
Trisomy 18 (Edwards syndrome): NEGATIVE
Trisomy 21 (Down syndrome): NEGATIVE
XXX (Triple X Syndrome): NOT DETECTED
XXY (Klinefelter Syndrome): NOT DETECTED
XYY (Jacobs Syndrome): NOT DETECTED

## 2020-03-12 ENCOUNTER — Other Ambulatory Visit: Payer: Self-pay

## 2020-03-12 ENCOUNTER — Ambulatory Visit (INDEPENDENT_AMBULATORY_CARE_PROVIDER_SITE_OTHER): Payer: Medicaid Other | Admitting: Obstetrics

## 2020-03-12 VITALS — BP 130/72 | Ht 64.0 in | Wt 264.4 lb

## 2020-03-12 DIAGNOSIS — O0991 Supervision of high risk pregnancy, unspecified, first trimester: Secondary | ICD-10-CM

## 2020-03-12 DIAGNOSIS — O0992 Supervision of high risk pregnancy, unspecified, second trimester: Secondary | ICD-10-CM

## 2020-03-12 DIAGNOSIS — Z3A22 22 weeks gestation of pregnancy: Secondary | ICD-10-CM

## 2020-03-12 NOTE — Progress Notes (Signed)
Routine Prenatal Care Visit  Subjective  Christina Riggs is a 30 y.o. G3P2002 at [redacted]w[redacted]d being seen today for ongoing prenatal care.  She is currently monitored for the following issues for this high-risk pregnancy and has Supervision of high risk pregnancy in first trimester and Obesity affecting pregnancy in first trimester on their problem list.  ----------------------------------------------------------------------------------- Patient reports heartburn.  She has not self treated for this. She also asks about waterbirth at [facility] Contractions: Not present. Vag. Bleeding: None.  Movement: Present. Leaking Fluid denies.  ----------------------------------------------------------------------------------- The following portions of the patient's history were reviewed and updated as appropriate: allergies, current medications, past family history, past medical history, past social history, past surgical history and problem list. Problem list updated.  Objective  Blood pressure 130/72, height 5\' 4"  (1.626 m), weight 264 lb 6.4 oz (119.9 kg), last menstrual period 11/04/2019. Pregravid weight 265 lb (120.2 kg) Total Weight Gain -9.6 oz (-0.272 kg) Urinalysis: Urine Protein    Urine Glucose    Fetal Status:     Movement: Present     General:  Alert, oriented and cooperative. Patient is in no acute distress.  Skin: Skin is warm and dry. No rash noted.   Cardiovascular: Normal heart rate noted  Respiratory: Normal respiratory effort, no problems with respiration noted  Abdomen: Soft, gravid, appropriate for gestational age. Pain/Pressure: Absent     Pelvic:  Cervical exam deferred        Extremities: Normal range of motion.     Mental Status: Normal mood and affect. Normal behavior. Normal judgment and thought content.   Assessment   29 y.o. 11/06/2019 at [redacted]w[redacted]d by  07/11/2020, by Ultrasound presenting for routine prenatal visit  Plan   pregnancy 3 Problems (from 02/02/20 to present)    Problem  Noted Resolved   Supervision of high risk pregnancy in first trimester 02/02/2020 by 02/04/2020, CNM No   Overview Addendum 03/12/2020  9:03 AM by 03/14/2020, CNM    Clinic [facility] Prenatal Labs  Dating By 18w u/s Blood type:   O+  Genetic Screen 1 Screen:    AFP:     Quad:     NIPS: Antibody: anti Lewis a- titer no indicated  Anatomic Christina Riggs NA Rubella:   immune Varicella: @VZVIGG @ immune  GTT  RPR: Non Reactive (08/05 1527)   Rhogam  NA HBsAg: Negative (08/05 1527)   Vaccines TDAP:                       Flu Shot: Covid: HIV: Non Reactive (08/05 1527)   Baby Food Breast                               GBS:   GC/CT:  Contraception  Pap:  CBB     CS/VBAC NA   Support Person Christina Riggs          Previous Version       Preterm labor symptoms and general obstetric precautions including but not limited to vaginal bleeding, contractions, leaking of fluid and fetal movement were reviewed in detail with the patient. Please refer to After Visit Summary for other counseling recommendations.  Suggestions for her heartburn:  Tums or liquid Maalox/Mylanta; add a Pepsid ro Xantac daily. Decrease greasy foods. Discussed water birth unavailable with [facility], could transfer to [facility] or [facility] in [place]. Advised to take a daily ASA, and will have growth scans at 28 weeks,  32, and 36 weeks. Christina Riggs, CNM  03/12/2020 9:37 AM    No follow-ups on file.  Christina Riggs, CNM  03/12/2020 9:32 AM

## 2020-03-22 ENCOUNTER — Other Ambulatory Visit: Payer: Self-pay

## 2020-03-22 ENCOUNTER — Ambulatory Visit (INDEPENDENT_AMBULATORY_CARE_PROVIDER_SITE_OTHER): Payer: Medicaid Other | Admitting: Obstetrics

## 2020-03-22 VITALS — BP 120/80 | Wt 262.0 lb

## 2020-03-22 DIAGNOSIS — Z3A24 24 weeks gestation of pregnancy: Secondary | ICD-10-CM

## 2020-03-22 DIAGNOSIS — O0991 Supervision of high risk pregnancy, unspecified, first trimester: Secondary | ICD-10-CM

## 2020-03-22 NOTE — Progress Notes (Signed)
  Routine Prenatal Care Visit  Subjective  Christina Riggs is a 30 y.o. G3P2002 at [redacted]w[redacted]d being seen today for ongoing prenatal care.  She is currently monitored for the following issues for this high-risk pregnancy and has Supervision of high risk pregnancy in first trimester and Obesity affecting pregnancy in first trimester on their problem list.  ----------------------------------------------------------------------------------- Patient reports no bleeding, no contractions, no cramping and she has had a bloody nose some headaches today.    .  .   Pincus Large Fluid denies.  ----------------------------------------------------------------------------------- The following portions of the patient's history were reviewed and updated as appropriate: allergies, current medications, past family history, past medical history, past social history, past surgical history and problem list. Problem list updated.  Objective  Blood pressure 120/80, weight 262 lb (118.8 kg), last menstrual period 11/04/2019. Pregravid weight 265 lb (120.2 kg) Total Weight Gain -3 lb (-1.361 kg) Urinalysis: Urine Protein    Urine Glucose    Fetal Status:           General:  Alert, oriented and cooperative. Patient is in no acute distress.  Skin: Skin is warm and dry. No rash noted.   Cardiovascular: Normal heart rate noted  Respiratory: Normal respiratory effort, no problems with respiration noted  Abdomen: Soft, gravid, appropriate for gestational age.       Pelvic:  Cervical exam deferred        Extremities: Normal range of motion.     Mental Status: Normal mood and affect. Normal behavior. Normal judgment and thought content.   Assessment   29 y.o. [id number] at [redacted]w[redacted]d by  07/11/2020, by Ultrasound presenting for routine prenatal visit  Plan   pregnancy 3 Problems (from 02/02/20 to present)    Problem Noted Resolved   Supervision of high risk pregnancy in first trimester 02/02/2020 by Tresea Mall, CNM No   Overview  Addendum 03/12/2020  9:03 AM by [name], CNM    Clinic [facility] Prenatal Labs  Dating By 18w u/s Blood type:   O+  Genetic Screen 1 Screen:    AFP:     Quad:     NIPS: Antibody: anti Lewis a- titer no indicated  Anatomic [place] NA Rubella:   immune Varicella: @VZVIGG @ immune  GTT  RPR: Non Reactive (08/05 1527)   Rhogam  NA HBsAg: Negative (08/05 1527)   Vaccines TDAP:                       Flu Shot: Covid: HIV: Non Reactive (08/05 1527)   Baby Food Breast                               GBS:   GC/CT:  Contraception  Pap:  CBB     CS/VBAC NA   Support Person Devanta          Previous Version       Preterm labor symptoms and general obstetric precautions including but not limited to vaginal bleeding, contractions, leaking of fluid and fetal movement were reviewed in detail with the patient. Please refer to After Visit Summary for other counseling recommendations.  Advised how to stop a nose bleed. Should these reoccur,we can refer her for tx. Tylenol for headaches   Return in about 4 weeks (around 04/19/2020) for return OB. glucose testing then  04/21/2020, CNM  03/22/2020 5:27 PM

## 2020-03-22 NOTE — Progress Notes (Signed)
ROB- nose bleeding last night and this morning, headache since last night, little dizziness, declines flu shot

## 2020-04-09 ENCOUNTER — Ambulatory Visit (INDEPENDENT_AMBULATORY_CARE_PROVIDER_SITE_OTHER): Payer: Medicaid Other | Admitting: Obstetrics

## 2020-04-09 ENCOUNTER — Other Ambulatory Visit: Payer: Self-pay

## 2020-04-09 VITALS — BP 120/70 | Ht 64.0 in | Wt 257.8 lb

## 2020-04-09 DIAGNOSIS — O0992 Supervision of high risk pregnancy, unspecified, second trimester: Secondary | ICD-10-CM

## 2020-04-09 DIAGNOSIS — Z3A26 26 weeks gestation of pregnancy: Secondary | ICD-10-CM

## 2020-04-09 LAB — POCT URINALYSIS DIPSTICK OB
Glucose, UA: NEGATIVE
POC,PROTEIN,UA: NEGATIVE

## 2020-04-09 NOTE — Progress Notes (Signed)
  Routine Prenatal Care Visit  Subjective  Christina Riggs is a 30 y.o. G3P2002 at [redacted]w[redacted]d being seen today for ongoing prenatal care.  She is currently monitored for the following issues for this high-risk pregnancy and has Supervision of high risk pregnancy in first trimester and Obesity affecting pregnancy in first trimester on their problem list.  ----------------------------------------------------------------------------------- Patient reports no complaints.   Contractions: Not present. Vag. Bleeding: None.  Movement: Present. Leaking Fluid denies.  ----------------------------------------------------------------------------------- The following portions of the patient's history were reviewed and updated as appropriate: allergies, current medications, past family history, past medical history, past social history, past surgical history and problem list. Problem list updated.  Objective  Blood pressure 120/70, height 5\' 4"  (1.626 m), weight 257 lb 12.8 oz (116.9 kg), last menstrual period 11/04/2019. Pregravid weight 265 lb (120.2 kg) Total Weight Gain -7 lb 3.2 oz (-3.266 kg) Urinalysis: Urine Protein    Urine Glucose    Fetal Status:     Movement: Present     General:  Alert, oriented and cooperative. Patient is in no acute distress.  Skin: Skin is warm and dry. No rash noted.   Cardiovascular: Normal heart rate noted  Respiratory: Normal respiratory effort, no problems with respiration noted  Abdomen: Soft, gravid, appropriate for gestational age. Pain/Pressure: Absent     Pelvic:  Cervical exam deferred        Extremities: Normal range of motion.     Mental Status: Normal mood and affect. Normal behavior. Normal judgment and thought content.   Assessment   29 y.o. 11/06/2019 at [redacted]w[redacted]d by  07/11/2020, by Ultrasound presenting for routine prenatal visit  Plan   pregnancy 3 Problems (from 02/02/20 to present)    Problem Noted Resolved   Supervision of high risk pregnancy in first  trimester 02/02/2020 by 02/04/2020, CNM No   Overview Addendum 03/12/2020  9:03 AM by 03/14/2020, CNM    Clinic [facility] Prenatal Labs  Dating By 18w u/s Blood type:   O+  Genetic Screen 1 Screen:    AFP:     Quad:     NIPS: Antibody: anti Lewis a- titer no indicated  Anatomic [name] NA Rubella:   immune Varicella: @VZVIGG @ immune  GTT  RPR: Non Reactive (08/05 1527)   Rhogam  NA HBsAg: Negative (08/05 1527)   Vaccines TDAP:                       Flu Shot: Covid: HIV: Non Reactive (08/05 1527)   Baby Food Breast                               GBS:   GC/CT:  Contraception  Pap:  CBB     CS/VBAC NA   Support Person Christina Riggs          Previous Version       Preterm labor symptoms and general obstetric precautions including but not limited to vaginal bleeding, contractions, leaking of fluid and fetal movement were reviewed in detail with the patient. Please refer to After Visit Summary for other counseling recommendations.   Return in about 2 weeks (around 04/23/2020) for return OB, 28 week labs. Growth ultraound ordered, but may be done at 30 weeks.  02-12-1985, CNM  04/09/2020 10:23 AM

## 2020-04-24 ENCOUNTER — Other Ambulatory Visit: Payer: Self-pay

## 2020-04-24 ENCOUNTER — Ambulatory Visit (INDEPENDENT_AMBULATORY_CARE_PROVIDER_SITE_OTHER): Payer: Medicaid Other | Admitting: Obstetrics

## 2020-04-24 ENCOUNTER — Other Ambulatory Visit: Payer: Medicaid Other

## 2020-04-24 VITALS — BP 128/84 | Wt 258.0 lb

## 2020-04-24 DIAGNOSIS — O0992 Supervision of high risk pregnancy, unspecified, second trimester: Secondary | ICD-10-CM

## 2020-04-24 DIAGNOSIS — Z3A28 28 weeks gestation of pregnancy: Secondary | ICD-10-CM

## 2020-04-24 DIAGNOSIS — O0991 Supervision of high risk pregnancy, unspecified, first trimester: Secondary | ICD-10-CM

## 2020-04-24 NOTE — Progress Notes (Signed)
A lot of HA's. No vb. No lof. 28 week labs today.

## 2020-04-24 NOTE — Progress Notes (Signed)
Routine Prenatal Care Visit  Subjective  Christina Riggs is a 30 y.o. G3P2002 at [redacted]w[redacted]d being seen today for ongoing prenatal care.  She is currently monitored for the following issues for this high-risk pregnancy and has Supervision of high risk pregnancy in first trimester and Obesity affecting pregnancy in first trimester on their problem list.  ----------------------------------------------------------------------------------- Patient reports headache.  They occur usually at work. She has tried dosing once with Tylenol, which has not helped much. Has not tried caffeine. They occur across her forehead. Contractions: Not present. Vag. Bleeding: None.   . Leaking Fluid denies.  ----------------------------------------------------------------------------------- The following portions of the patient's history were reviewed and updated as appropriate: allergies, current medications, past family history, past medical history, past social history, past surgical history and problem list. Problem list updated.  Objective  Blood pressure 128/84, weight 258 lb (117 kg), last menstrual period 11/04/2019. Pregravid weight 265 lb (120.2 kg) Total Weight Gain -7 lb (-3.175 kg) Urinalysis: Urine Protein    Urine Glucose    Fetal Status:           General:  Alert, oriented and cooperative. Patient is in no acute distress.  Skin: Skin is warm and dry. No rash noted.   Cardiovascular: Normal heart rate noted  Respiratory: Normal respiratory effort, no problems with respiration noted  Abdomen: Soft, gravid, appropriate for gestational age. Pain/Pressure: Absent     Pelvic:  Cervical exam deferred        Extremities: Normal range of motion.     Mental Status: Normal mood and affect. Normal behavior. Normal judgment and thought content.   Assessment   29 y.o. [id number] at [redacted]w[redacted]d by  07/11/2020, by Ultrasound presenting for routine prenatal visit  Plan   pregnancy 3 Problems (from 02/02/20 to present)     Problem Noted Resolved   Supervision of high risk pregnancy in first trimester 02/02/2020 by Tresea Mall, CNM No   Overview Addendum 03/12/2020  9:03 AM by [name], CNM    Clinic [facility] Prenatal Labs  Dating By 18w u/s Blood type:   O+  Genetic Screen 1 Screen:    AFP:     Quad:     NIPS: Antibody: anti Lewis a- titer no indicated  Anatomic [place] NA Rubella:   immune Varicella: @VZVIGG @ immune  GTT  RPR: Non Reactive (08/05 1527)   Rhogam  NA HBsAg: Negative (08/05 1527)   Vaccines TDAP:                       Flu Shot: Covid: HIV: Non Reactive (08/05 1527)   Baby Food Breast                               GBS:   GC/CT:  Contraception  Pap:  CBB     CS/VBAC NA   Support Person Devanta          Previous Version       Preterm labor symptoms and general obstetric precautions including but not limited to vaginal bleeding, contractions, leaking of fluid and fetal movement were reviewed in detail with the patient. Please refer to After Visit Summary for other counseling recommendations.   Regarding her headaches, suggested trying caffeine, increasing the number of times she doses herself with Tylenol, keeping well hydrated along with dating small amounts every few hours. Having her 28 week labs today. She is taking a PNV- not listed  on her medication list.  Return in about 2 weeks (around 05/08/2020) for return OB.  [name], CNM  04/24/2020 10:17 AM

## 2020-04-25 LAB — 28 WEEK RH+PANEL
Basophils Absolute: 0 10*3/uL (ref 0.0–0.2)
Basos: 0 %
EOS (ABSOLUTE): 0 10*3/uL (ref 0.0–0.4)
Eos: 1 %
Gestational Diabetes Screen: 137 mg/dL (ref 65–139)
HIV Screen 4th Generation wRfx: NONREACTIVE
Hematocrit: 32.9 % — ABNORMAL LOW (ref 34.0–46.6)
Hemoglobin: 11.1 g/dL (ref 11.1–15.9)
Immature Grans (Abs): 0 10*3/uL (ref 0.0–0.1)
Immature Granulocytes: 0 %
Lymphocytes Absolute: 1.4 10*3/uL (ref 0.7–3.1)
Lymphs: 26 %
MCH: 30.7 pg (ref 26.6–33.0)
MCHC: 33.7 g/dL (ref 31.5–35.7)
MCV: 91 fL (ref 79–97)
Monocytes Absolute: 0.2 10*3/uL (ref 0.1–0.9)
Monocytes: 5 %
Neutrophils Absolute: 3.6 10*3/uL (ref 1.4–7.0)
Neutrophils: 68 %
Platelets: 254 10*3/uL (ref 150–450)
RBC: 3.62 x10E6/uL — ABNORMAL LOW (ref 3.77–5.28)
RDW: 12.6 % (ref 11.7–15.4)
RPR Ser Ql: NONREACTIVE
WBC: 5.3 10*3/uL (ref 3.4–10.8)

## 2020-04-28 ENCOUNTER — Encounter: Payer: Self-pay | Admitting: Obstetrics and Gynecology

## 2020-04-28 ENCOUNTER — Observation Stay
Admission: EM | Admit: 2020-04-28 | Discharge: 2020-04-28 | Disposition: A | Discharge status: Home / Self Care | Payer: Medicaid Other | Attending: Obstetrics and Gynecology | Admitting: Obstetrics and Gynecology

## 2020-04-28 ENCOUNTER — Other Ambulatory Visit: Payer: Self-pay

## 2020-04-28 DIAGNOSIS — Z3490 Encounter for supervision of normal pregnancy, unspecified, unspecified trimester: Secondary | ICD-10-CM

## 2020-04-28 DIAGNOSIS — N898 Other specified noninflammatory disorders of vagina: Secondary | ICD-10-CM | POA: Diagnosis not present

## 2020-04-28 DIAGNOSIS — O99211 Obesity complicating pregnancy, first trimester: Secondary | ICD-10-CM | POA: Diagnosis present

## 2020-04-28 DIAGNOSIS — Z87891 Personal history of nicotine dependence: Secondary | ICD-10-CM | POA: Diagnosis not present

## 2020-04-28 DIAGNOSIS — Z3A29 29 weeks gestation of pregnancy: Secondary | ICD-10-CM | POA: Insufficient documentation

## 2020-04-28 DIAGNOSIS — O133 Gestational [pregnancy-induced] hypertension without significant proteinuria, third trimester: Secondary | ICD-10-CM | POA: Insufficient documentation

## 2020-04-28 DIAGNOSIS — O26893 Other specified pregnancy related conditions, third trimester: Principal | ICD-10-CM | POA: Insufficient documentation

## 2020-04-28 DIAGNOSIS — O0991 Supervision of high risk pregnancy, unspecified, first trimester: Secondary | ICD-10-CM

## 2020-04-28 LAB — WET PREP, GENITAL
Clue Cells Wet Prep HPF POC: NONE SEEN
Sperm: NONE SEEN
Trich, Wet Prep: NONE SEEN
Yeast Wet Prep HPF POC: NONE SEEN

## 2020-04-28 LAB — CHLAMYDIA/NGC RT PCR (ARMC ONLY)
Chlamydia Tr: NOT DETECTED
N gonorrhoeae: NOT DETECTED

## 2020-04-28 NOTE — OB Triage Note (Signed)
Patient comes to OBS 1 today with complaint of increased thick white discharge that she has had to wear a pad.  She denies vaginal irritation and itching.She reports baby moving well.  No bleeding, No LOF. 121/73 hr 95, rr 14 oral temp 97.9.

## 2020-04-28 NOTE — Final Progress Note (Signed)
Physician Final Progress Note  Patient ID: Shontia Gillooly MRN: [id number] DOB/AGE: [birth year] 30 y.o.  Admit date: 04/28/2020 Admitting provider: Conard Novak, MD Discharge date: 04/28/2020   Admission Diagnoses:  1) intrauterine pregnancy at [redacted]w[redacted]d  2) vaginal discharge during pregnancy, third trimester  Discharge Diagnoses:  Principal Problem:   Vaginal discharge during pregnancy in third trimester Active Problems:   Supervision of high risk pregnancy in first trimester   Obesity affecting pregnancy in first trimester   Pregnancy    History of Present Illness: The patient is a 30 y.o. female G3P2002 at [redacted]w[redacted]d who presents for abnormal vaginal discharge. The discharge is described as white.  She notes no bleeding, no leaking fluid, and no contractions. She notes +FM. She notes no vaginal irritation symptoms.  She has no acute complaints.    Past Medical History:  Diagnosis Date  . Fracture of foot    Right foot fracture 10/2019  . Hypertension    per pt report    Past Surgical History:  Procedure Laterality Date  . NO PAST SURGERIES      No current facility-administered medications on file prior to encounter.   Current Outpatient Medications on File Prior to Encounter  Medication Sig Dispense Refill  . acetaminophen (TYLENOL) 325 MG tablet Take 650 mg by mouth every 6 (six) hours as needed.    . Prenatal Vit-Fe Fumarate-FA (PRENATAL MULTIVITAMIN) TABS tablet Take 1 tablet by mouth daily at 12 noon.      No Known Allergies  Social History   Socioeconomic History  . Marital status: Single    Spouse name: Not on file  . Number of children: Not on file  . Years of education: Not on file  . Highest education level: Not on file  Occupational History  . Not on file  Tobacco Use  . Smoking status: Former Smoker    Years: 2.00    Types: Cigars  . Smokeless tobacco: Never Used  . Tobacco comment: 1  day  Vaping Use  . Vaping Use: Never used  Substance and Sexual  Activity  . Alcohol use: Not Currently    Comment: last use 07/2019  . Drug use: No  . Sexual activity: Yes    Comment: no bcm since IUD removed 08/2019  Other Topics Concern  . Not on file  Social History Narrative  . Not on file   Social Determinants of Health   Financial Resource Strain: Not on file  Food Insecurity: Not on file  Transportation Needs: Not on file  Physical Activity: Not on file  Stress: Not on file  Social Connections: Not on file  Intimate Partner Violence: Unknown  . Fear of Current or Ex-Partner: Not on file  . Emotionally Abused: No  . Physically Abused: No  . Sexually Abused: No    Family History  Problem Relation Age of Onset  . Hypertension Father   . Stroke Father      Review of Systems  Constitutional: Negative.   HENT: Negative.   Eyes: Negative.   Respiratory: Negative.   Cardiovascular: Negative.   Gastrointestinal: Negative.   Genitourinary: Negative.        See HPI  Musculoskeletal: Negative.   Skin: Negative.   Neurological: Negative.   Psychiatric/Behavioral: Negative.      Physical Exam: BP 107/63 (BP Location: Left Arm)   Pulse 98   Temp 98.1 F (36.7 C) (Oral)   Resp 16   Ht 5\' 4"  (1.626 m)   Wt  117.5 kg Comment: 259lbs  LMP 11/04/2019 Comment: light and heavy x 3-4 days  BMI 44.46 kg/m     Consults: None  Significant Findings/ Diagnostic Studies:  Lab Results  Component Value Date   TRICHWETPREP NONE SEEN 04/28/2020   CLUECESSL NONE SEEN 04/28/2020   WBCWETPREP PRESENT (A) 04/28/2020   YEASTWETPREP NONE SEEN 04/28/2020    Lab Results  Component Value Date   CHLAMYDIA NOT DETECTED 04/28/2020   NGONORRHOEAE NOT DETECTED 04/28/2020    Procedures:  NST: Baseline FHR: 140 beats/min Variability: moderate Accelerations: present Decelerations: absent Tocometry: infrequent  Interpretation:  INDICATIONS: rule out uterine contractions RESULTS:  A NST procedure was performed with FHR monitoring and a normal  baseline established, appropriate time of 20-40 minutes of evaluation, and accels >2 seen w 15x15 characteristics.  Results show a REACTIVE NST.    Hospital Course: The patient was admitted to Labor and Delivery Triage for observation. She had negative vaginitis testing. She had a reassuring fetal tracing given gestational age.  She had normal vital signs. She was discharged after reassurance.   Discharge Condition: stable  Disposition: Discharge disposition: 01-Home or Self Care       Diet: Regular diet  Discharge Activity: Activity as tolerated   Signed: Thomasene Mohair, MD  04/28/2020, 9:12 PM

## 2020-04-28 NOTE — Discharge Summary (Signed)
See Final progress note 

## 2020-04-28 NOTE — OB Triage Note (Signed)
Discharge instructions reviewed and plan of care discussed. Pt verbalized understanding and in agreement with plan. Pt encouraged to follow-up with provider for worsening symptoms. Pt stable at time of discharge with all questions answered.

## 2020-05-04 ENCOUNTER — Other Ambulatory Visit: Payer: Self-pay

## 2020-05-04 ENCOUNTER — Encounter: Payer: Self-pay | Admitting: Obstetrics & Gynecology

## 2020-05-04 ENCOUNTER — Inpatient Hospital Stay
Admission: EM | Admit: 2020-05-04 | Discharge: 2020-05-07 | DRG: 805 | Disposition: A | Discharge status: Home / Self Care | Payer: Medicaid Other | Attending: Obstetrics & Gynecology | Admitting: Obstetrics & Gynecology

## 2020-05-04 DIAGNOSIS — O42013 Preterm premature rupture of membranes, onset of labor within 24 hours of rupture, third trimester: Secondary | ICD-10-CM | POA: Diagnosis not present

## 2020-05-04 DIAGNOSIS — O42919 Preterm premature rupture of membranes, unspecified as to length of time between rupture and onset of labor, unspecified trimester: Secondary | ICD-10-CM | POA: Diagnosis present

## 2020-05-04 DIAGNOSIS — Z3A30 30 weeks gestation of pregnancy: Secondary | ICD-10-CM

## 2020-05-04 DIAGNOSIS — O99214 Obesity complicating childbirth: Secondary | ICD-10-CM | POA: Diagnosis present

## 2020-05-04 DIAGNOSIS — O42913 Preterm premature rupture of membranes, unspecified as to length of time between rupture and onset of labor, third trimester: Principal | ICD-10-CM | POA: Diagnosis present

## 2020-05-04 DIAGNOSIS — Z20822 Contact with and (suspected) exposure to covid-19: Secondary | ICD-10-CM | POA: Diagnosis present

## 2020-05-04 DIAGNOSIS — O0991 Supervision of high risk pregnancy, unspecified, first trimester: Secondary | ICD-10-CM

## 2020-05-04 LAB — COMPREHENSIVE METABOLIC PANEL
ALT: 17 U/L (ref 0–44)
AST: 18 U/L (ref 15–41)
Albumin: 3.1 g/dL — ABNORMAL LOW (ref 3.5–5.0)
Alkaline Phosphatase: 90 U/L (ref 38–126)
Anion gap: 10 (ref 5–15)
BUN: <5 mg/dL — ABNORMAL LOW (ref 6–20)
CO2: 21 mmol/L — ABNORMAL LOW (ref 22–32)
Calcium: 9 mg/dL (ref 8.9–10.3)
Chloride: 105 mmol/L (ref 98–111)
Creatinine, Ser: 0.5 mg/dL (ref 0.44–1.00)
GFR, Estimated: >60 mL/min (ref >60)
Glucose, Bld: 94 mg/dL (ref 70–99)
Potassium: 3.3 mmol/L — ABNORMAL LOW (ref 3.5–5.1)
Sodium: 136 mmol/L (ref 135–145)
Total Bilirubin: 0.6 mg/dL (ref 0.3–1.2)
Total Protein: 7.1 g/dL (ref 6.5–8.1)

## 2020-05-04 LAB — URINALYSIS, ROUTINE W REFLEX MICROSCOPIC
Bacteria, UA: NONE SEEN
Bilirubin Urine: NEGATIVE
Glucose, UA: NEGATIVE mg/dL
Hgb urine dipstick: NEGATIVE
Ketones, ur: NEGATIVE mg/dL
Nitrite: NEGATIVE
Protein, ur: NEGATIVE mg/dL
Specific Gravity, Urine: 1.015 (ref 1.005–1.030)
pH: 6 (ref 5.0–8.0)

## 2020-05-04 LAB — URINE DRUG SCREEN, QUALITATIVE (ARMC ONLY)
Amphetamines, Ur Screen: NOT DETECTED
Barbiturates, Ur Screen: NOT DETECTED
Benzodiazepine, Ur Scrn: NOT DETECTED
Cannabinoid 50 Ng, Ur ~~LOC~~: NOT DETECTED
Cocaine Metabolite,Ur ~~LOC~~: NOT DETECTED
MDMA (Ecstasy)Ur Screen: NOT DETECTED
Methadone Scn, Ur: NOT DETECTED
Opiate, Ur Screen: NOT DETECTED
Phencyclidine (PCP) Ur S: NOT DETECTED
Tricyclic, Ur Screen: NOT DETECTED

## 2020-05-04 LAB — CBC
HCT: 33.5 % — ABNORMAL LOW (ref 36.0–46.0)
Hemoglobin: 11.4 g/dL — ABNORMAL LOW (ref 12.0–15.0)
MCH: 30.4 pg (ref 26.0–34.0)
MCHC: 34 g/dL (ref 30.0–36.0)
MCV: 89.3 fL (ref 80.0–100.0)
Platelets: 303 10*3/uL (ref 150–400)
RBC: 3.75 MIL/uL — ABNORMAL LOW (ref 3.87–5.11)
RDW: 12.9 % (ref 11.5–15.5)
WBC: 7.7 10*3/uL (ref 4.0–10.5)
nRBC: 0 % (ref 0.0–0.2)

## 2020-05-04 LAB — ABO/RH: ABO/RH(D): O POS

## 2020-05-04 LAB — RUPTURE OF MEMBRANE (ROM)PLUS: Rom Plus: POSITIVE

## 2020-05-04 LAB — WET PREP, GENITAL
Clue Cells Wet Prep HPF POC: NONE SEEN
Sperm: NONE SEEN
Trich, Wet Prep: NONE SEEN
Yeast Wet Prep HPF POC: NONE SEEN

## 2020-05-04 LAB — TYPE AND SCREEN
ABO/RH(D): O POS
Antibody Screen: NEGATIVE

## 2020-05-04 LAB — RESP PANEL BY RT-PCR (FLU A&B, COVID) ARPGX2
Influenza A by PCR: NEGATIVE
Influenza B by PCR: NEGATIVE
SARS Coronavirus 2 by RT PCR: NEGATIVE

## 2020-05-04 MED ORDER — ACETAMINOPHEN 325 MG PO TABS
650.0000 mg | ORAL_TABLET | ORAL | Status: DC | PRN
  Filled 2020-05-04: qty 2

## 2020-05-04 MED ORDER — CALCIUM CARBONATE ANTACID 500 MG PO CHEW: 2.0000 | CHEWABLE_TABLET | ORAL | Status: DC | PRN

## 2020-05-04 MED ORDER — ZOLPIDEM TARTRATE 5 MG PO TABS: 5.0000 mg | ORAL_TABLET | Freq: Every evening | ORAL | Status: DC | PRN

## 2020-05-04 MED ORDER — DOCUSATE SODIUM 100 MG PO CAPS
100.0000 mg | ORAL_CAPSULE | Freq: Every day | ORAL | Status: DC
  Administered 2020-05-04: 100 mg via ORAL
  Filled 2020-05-04: qty 1

## 2020-05-04 MED ORDER — SODIUM CHLORIDE 0.9 % IV SOLN
1.0000 g | Freq: Four times a day (QID) | INTRAVENOUS | Status: DC
  Administered 2020-05-04 – 2020-05-05 (×3): 1 g via INTRAVENOUS
  Filled 2020-05-04 (×2): qty 1000
  Filled 2020-05-04: qty 1

## 2020-05-04 MED ORDER — AZITHROMYCIN 1 G PO PACK
1.0000 g | PACK | Freq: Once | ORAL | Status: AC
  Administered 2020-05-04: 1 g via ORAL
  Filled 2020-05-04: qty 1

## 2020-05-04 MED ORDER — POTASSIUM CHLORIDE IN NACL 20-0.45 MEQ/L-% IV SOLN
INTRAVENOUS | Status: DC
  Filled 2020-05-04 (×4): qty 1000

## 2020-05-04 MED ORDER — PRENATAL MULTIVITAMIN CH: 1.0000 | ORAL_TABLET | Freq: Every day | ORAL | Status: DC

## 2020-05-04 MED ORDER — BETAMETHASONE SOD PHOS & ACET 6 (3-3) MG/ML IJ SUSP
12.0000 mg | INTRAMUSCULAR | Status: DC
  Administered 2020-05-04: 12 mg via INTRAMUSCULAR
  Filled 2020-05-04: qty 5

## 2020-05-04 NOTE — Consult Note (Signed)
      30 + Weeks  Neonatology Consult  Note:  At the request of the patients obstetrician Dr. Kenton Kingfisher    I met with  Christina Riggs  who is at   30.2wks currently with preg complicated by   PPROM today.   We reviewed initial delivery room management, including CPAP, Kennedy, and  possible need for intubation for surfactant administration. We discussed possible risk for IVH with potential for motor / cognitive deficits, and  ROP and screenings for each during NICU hospitalization.   We discussed feeding immaturity and need for full po intake with multiple days of good weight gain and no apnea or bradycardia before discharge.  We reviewed increased risk of jaundice, infection, and temperature instability.   Discussed likely length of stay.  Thank you for allowing [place] to participate in her care.  Please call with questions. Allysson Rinehimer P. Shelda Altes NNP-BC Neonatal Nurse Practitioner    The total length of face-to-face or floor / unit time for this encounter was 30 minutes.  Counseling and / or coordination of care was greater than fifty percent of the time.

## 2020-05-04 NOTE — OB Triage Note (Signed)
Pt stated that she thinks her water broke at 1430 today. + Fetal Movement. Denies vaginal bleeding. Karlyn Agee

## 2020-05-04 NOTE — H&P (Addendum)
Obstetrics Admission History & Physical   Rupture of Membranes   HPI:  30 y.o. [id number] @ [redacted]w[redacted]d (07/11/2020, by Ultrasound). Admitted on 05/04/2020:   Patient Active Problem List   Diagnosis Date Noted  . Preterm premature rupture of membranes 05/04/2020  . [redacted] weeks gestation of pregnancy 05/04/2020  . Pregnancy 04/28/2020  . Vaginal discharge during pregnancy in third trimester 04/28/2020  . Supervision of high risk pregnancy in first trimester 02/02/2020  . Obesity affecting pregnancy in first trimester 02/02/2020     Presents for episode of leakage of fluid at 1430 today while at work [employer], on feet a lot).  No pain or ctxs.  No VB.  Pregnancy complicated by obesity.  Denies HTN or GDM.  Prior NSVD x2, 7 and 11 years ago.  Precipitous delivery with G2 (at home).   Prenatal care at: at [facility]. Pregnancy complicated by none.  ROS: A review of systems was performed and negative, except as stated in the above HPI. PMHx:  Past Medical History:  Diagnosis Date  . Fracture of foot    Right foot fracture 10/2019  . Hypertension    per pt report   PSHx:  Past Surgical History:  Procedure Laterality Date  . NO PAST SURGERIES     Medications:  Medications Prior to Admission  Medication Sig Dispense Refill Last Dose  . acetaminophen (TYLENOL) 325 MG tablet Take 650 mg by mouth every 6 (six) hours as needed.   05/03/2020 at Unknown time  . Prenatal Vit-Fe Fumarate-FA (PRENATAL MULTIVITAMIN) TABS tablet Take 1 tablet by mouth daily at 12 noon.   05/03/2020 at Unknown time   Allergies: has No Known Allergies. OBHx:  OB History  Gravida Para Term Preterm AB Living  3 2 2  0 0 2  SAB IAB Ectopic Multiple Live Births  0 0 0 0 2    # Outcome Date GA Lbr Len/2nd Weight Sex Delivery Anes PTL Lv  3 Current           2 Term 08/25/12    M      1 Term 03/14/10    F       03/16/10 except as detailed in HPI.HWE:XHBZJIRC/[id number]  No family history of birth defects. Soc Hx: Alcohol: none and  Recreational drug use: none  Objective:   Vitals:   05/04/20 1631  BP: (!) 107/57  Pulse: 96  Resp: 16  Temp: 98.4 F (36.9 C)   Constitutional: Well nourished, well developed female in no acute distress.  HEENT: normal Skin: Warm and dry.  Cardiovascular:Regular rate and rhythm.   Extremity: trace to 1+ bilateral pedal edema Respiratory: Clear to auscultation bilateral. Normal respiratory effort Abdomen: gravid, ND, FHT present, mild tenderness on exam Back: no CVAT Neuro: DTRs 2+, Cranial nerves grossly intact Psych: Alert and Oriented x3. No memory deficits. Normal mood and affect.  MS: normal gait, normal bilateral lower extremity ROM/strength/stability.  Pelvic exam: is limited by body habitus EGBUS: within normal limits Vagina: within normal limits and with normal mucosa Cervix: EXTERNAL GENITALIA: normal appearing vulva with no masses, tenderness or lesions CERVIX: 0-1 cm dilated, 30 effaced, -3 station, presenting part VTX VAGINA: no abnormalities noted MEMBRANES: ruptured, clear fluid, POOLING POS Uterus: Uterus demonstrates irritability pattern.  Adnexa: not evaluated  EFM:FHR: 140 bpm, variability: moderate,  accelerations:  Present,  decelerations:  Absent Toco: None   Perinatal info:  Blood type: O positive Rubella- Immune Varicella -Immune TDaP tetanus status unknown to the patient RPR NR /  HIV Neg/ HBsAg Neg   ROM PLUS POS  Assessment & Plan:   29 y.o. [id number] @ [redacted]w[redacted]d, Admitted on 05/04/2020:PPROM 30 weeks  IV ABX and one dose Aritheromycin per ACOG protocol for PPROM <34 weeks    Recommended protocol of administering a seven-day course of prophylactic antibiotics to all women with PPROM <34+0 weeks of gestation who are managed expectantly. Our preference is: ?Azithromycin 1 gram orally upon admission, plus ?Ampicillin 2 grams intravenously every 6 hours for 48 hours, followed by ?Amoxicillin 875 mg orally every 12 hours or 500 mg orally every 8  hours for an additional five days  BMZ x2 doses DVT prophylaxis Labs, CBC and GBS and Covid and UDS Bedrest and monitoring  FHR monitoring and toco Magnesium for neuroprotection will be delayed until active labor is encountered or prior to induction if this is needed Neo and Anes consults  Counseled risks of PTL as well as infection following PPROM, and will be monitored extensively for these outcomes.  Otherwise will maintain pregnancy until 32 weeks, at which time IOL will be considered.  Patient has been counseled as to the risks of prematurity, including risks of respiratory depression or distress, jaundice, feeding or temperature regulation problems, neurologic concerns including hearing or visual problems, and brain complications.  Patient understands the risks of tocolytic therapies along with their inherent failure rates, and the reasons for and against their use in individual circumstances.  Also, tocolytic therapy has its limitations and risks w PPROM, and so is not considered at this time.   Annamarie Major, MD, [facility], [facility] 05/04/2020  6:25 PM

## 2020-05-05 ENCOUNTER — Encounter: Payer: Self-pay | Admitting: Obstetrics & Gynecology

## 2020-05-05 ENCOUNTER — Inpatient Hospital Stay: Payer: Medicaid Other | Admitting: Anesthesiology

## 2020-05-05 DIAGNOSIS — Z3A30 30 weeks gestation of pregnancy: Secondary | ICD-10-CM

## 2020-05-05 DIAGNOSIS — O42013 Preterm premature rupture of membranes, onset of labor within 24 hours of rupture, third trimester: Secondary | ICD-10-CM

## 2020-05-05 LAB — CBC
HCT: 31 % — ABNORMAL LOW (ref 36.0–46.0)
Hemoglobin: 10.6 g/dL — ABNORMAL LOW (ref 12.0–15.0)
MCH: 30.7 pg (ref 26.0–34.0)
MCHC: 34.2 g/dL (ref 30.0–36.0)
MCV: 89.9 fL (ref 80.0–100.0)
Platelets: 272 10*3/uL (ref 150–400)
RBC: 3.45 MIL/uL — ABNORMAL LOW (ref 3.87–5.11)
RDW: 12.9 % (ref 11.5–15.5)
WBC: 10 10*3/uL (ref 4.0–10.5)
nRBC: 0 % (ref 0.0–0.2)

## 2020-05-05 LAB — GROUP B STREP BY PCR: Group B strep by PCR: NEGATIVE

## 2020-05-05 MED ORDER — CALCIUM GLUCONATE 10 % IV SOLN
INTRAVENOUS | Status: AC
  Filled 2020-05-05: qty 10

## 2020-05-05 MED ORDER — FENTANYL 2.5 MCG/ML W/ROPIVACAINE 0.15% IN NS 100 ML EPIDURAL (ARMC)
12.0000 mL/h | EPIDURAL | Status: DC
Start: 2020-05-05 — End: 2020-05-05
  Administered 2020-05-05 (×2): 12 mL/h via EPIDURAL
  Filled 2020-05-05: qty 100

## 2020-05-05 MED ORDER — SENNOSIDES-DOCUSATE SODIUM 8.6-50 MG PO TABS
2.0000 | ORAL_TABLET | ORAL | Status: DC
  Administered 2020-05-05 – 2020-05-07 (×3): 2 via ORAL
  Filled 2020-05-05 (×3): qty 2

## 2020-05-05 MED ORDER — ONDANSETRON HCL 4 MG PO TABS
4.0000 mg | ORAL_TABLET | ORAL | Status: DC | PRN
  Filled 2020-05-05: qty 1

## 2020-05-05 MED ORDER — BUTORPHANOL TARTRATE 1 MG/ML IJ SOLN
INTRAMUSCULAR | Status: AC
  Filled 2020-05-05: qty 1

## 2020-05-05 MED ORDER — DIPHENHYDRAMINE HCL 50 MG/ML IJ SOLN: 12.5000 mg | INTRAMUSCULAR | Status: DC | PRN

## 2020-05-05 MED ORDER — BUTORPHANOL TARTRATE 1 MG/ML IJ SOLN
1.0000 mg | INTRAMUSCULAR | Status: DC | PRN
  Administered 2020-05-05: 1 mg via INTRAVENOUS

## 2020-05-05 MED ORDER — IBUPROFEN 600 MG PO TABS
600.0000 mg | ORAL_TABLET | Freq: Four times a day (QID) | ORAL | Status: DC
  Administered 2020-05-05 – 2020-05-06 (×5): 600 mg via ORAL
  Filled 2020-05-05 (×6): qty 1

## 2020-05-05 MED ORDER — MAGNESIUM SULFATE 40 GM/1000ML IV SOLN
1.0000 g/h | INTRAVENOUS | Status: DC
  Administered 2020-05-05: 1 g/h via INTRAVENOUS
  Filled 2020-05-05: qty 1000

## 2020-05-05 MED ORDER — OXYTOCIN 10 UNIT/ML IJ SOLN
INTRAMUSCULAR | Status: AC
  Filled 2020-05-05: qty 2

## 2020-05-05 MED ORDER — METHYLERGONOVINE MALEATE 0.2 MG/ML IJ SOLN
INTRAMUSCULAR | Status: AC
  Administered 2020-05-05: 0.2 mg via INTRAMUSCULAR
  Filled 2020-05-05: qty 1

## 2020-05-05 MED ORDER — EPHEDRINE 5 MG/ML INJ: 10.0000 mg | INTRAVENOUS | Status: DC | PRN

## 2020-05-05 MED ORDER — DIBUCAINE (PERIANAL) 1 % EX OINT
1.0000 | TOPICAL_OINTMENT | CUTANEOUS | Status: DC | PRN
Start: 2020-05-05 — End: 2020-05-08

## 2020-05-05 MED ORDER — OXYTOCIN-SODIUM CHLORIDE 30-0.9 UT/500ML-% IV SOLN
2.5000 [IU]/h | INTRAVENOUS | Status: DC
  Administered 2020-05-05: 2.5 [IU]/h via INTRAVENOUS
  Filled 2020-05-05: qty 500

## 2020-05-05 MED ORDER — SIMETHICONE 80 MG PO CHEW: 80.0000 mg | CHEWABLE_TABLET | ORAL | Status: DC | PRN

## 2020-05-05 MED ORDER — SOD CITRATE-CITRIC ACID 500-334 MG/5ML PO SOLN
ORAL | Status: AC
  Filled 2020-05-05: qty 15

## 2020-05-05 MED ORDER — MAGNESIUM SULFATE 4 GM/100ML IV SOLN
4.0000 g | Freq: Once | INTRAVENOUS | Status: AC
  Administered 2020-05-05: 4 g via INTRAVENOUS
  Filled 2020-05-05: qty 100

## 2020-05-05 MED ORDER — METHYLERGONOVINE MALEATE 0.2 MG/ML IJ SOLN: 0.2000 mg | Freq: Once | INTRAMUSCULAR | Status: AC

## 2020-05-05 MED ORDER — PHENYLEPHRINE 40 MCG/ML (10ML) SYRINGE FOR IV PUSH (FOR BLOOD PRESSURE SUPPORT): 80.0000 ug | PREFILLED_SYRINGE | INTRAVENOUS | Status: DC | PRN

## 2020-05-05 MED ORDER — TETANUS-DIPHTH-ACELL PERTUSSIS 5-2.5-18.5 LF-MCG/0.5 IM SUSY
0.5000 mL | PREFILLED_SYRINGE | Freq: Once | INTRAMUSCULAR | Status: DC
  Filled 2020-05-05 (×2): qty 0.5

## 2020-05-05 MED ORDER — ZOLPIDEM TARTRATE 5 MG PO TABS: 5.0000 mg | ORAL_TABLET | Freq: Every evening | ORAL | Status: DC | PRN

## 2020-05-05 MED ORDER — LIDOCAINE-EPINEPHRINE (PF) 1.5 %-1:200000 IJ SOLN
INTRAMUSCULAR | Status: DC | PRN
  Administered 2020-05-05: 4 mL via EPIDURAL

## 2020-05-05 MED ORDER — WITCH HAZEL-GLYCERIN EX PADS: 1.0000 "application " | MEDICATED_PAD | CUTANEOUS | Status: DC | PRN

## 2020-05-05 MED ORDER — OXYTOCIN BOLUS FROM INFUSION
333.0000 mL | Freq: Once | INTRAVENOUS | Status: AC
  Administered 2020-05-05: 333 mL via INTRAVENOUS

## 2020-05-05 MED ORDER — MISOPROSTOL 200 MCG PO TABS
ORAL_TABLET | ORAL | Status: AC
  Administered 2020-05-05: 800 ug
  Filled 2020-05-05: qty 4

## 2020-05-05 MED ORDER — DIPHENHYDRAMINE HCL 25 MG PO CAPS: 25.0000 mg | ORAL_CAPSULE | Freq: Four times a day (QID) | ORAL | Status: DC | PRN

## 2020-05-05 MED ORDER — BUPIVACAINE HCL (PF) 0.25 % IJ SOLN
INTRAMUSCULAR | Status: DC | PRN
  Administered 2020-05-05 (×2): 4 mL via EPIDURAL

## 2020-05-05 MED ORDER — LACTATED RINGERS IV SOLN: INTRAVENOUS | Status: DC

## 2020-05-05 MED ORDER — COCONUT OIL OIL: 1.0000 "application " | TOPICAL_OIL | Status: DC | PRN

## 2020-05-05 MED ORDER — AMMONIA AROMATIC IN INHA
RESPIRATORY_TRACT | Status: AC
  Filled 2020-05-05: qty 10

## 2020-05-05 MED ORDER — ACETAMINOPHEN 325 MG PO TABS
650.0000 mg | ORAL_TABLET | ORAL | Status: DC | PRN
  Administered 2020-05-05 – 2020-05-07 (×3): 650 mg via ORAL
  Filled 2020-05-05 (×3): qty 2

## 2020-05-05 MED ORDER — LACTATED RINGERS IV SOLN
500.0000 mL | Freq: Once | INTRAVENOUS | Status: AC
  Administered 2020-05-05: 500 mL via INTRAVENOUS

## 2020-05-05 MED ORDER — LIDOCAINE HCL (PF) 1 % IJ SOLN
INTRAMUSCULAR | Status: AC
  Filled 2020-05-05: qty 30

## 2020-05-05 MED ORDER — BENZOCAINE-MENTHOL 20-0.5 % EX AERO
1.0000 "application " | INHALATION_SPRAY | CUTANEOUS | Status: DC | PRN
  Filled 2020-05-05: qty 56

## 2020-05-05 MED ORDER — OXYTOCIN-SODIUM CHLORIDE 30-0.9 UT/500ML-% IV SOLN
INTRAVENOUS | Status: AC
  Filled 2020-05-05: qty 500

## 2020-05-05 MED ORDER — ONDANSETRON HCL 4 MG/2ML IJ SOLN: 4.0000 mg | INTRAMUSCULAR | Status: DC | PRN

## 2020-05-05 MED ORDER — LIDOCAINE HCL (PF) 1 % IJ SOLN
INTRAMUSCULAR | Status: DC | PRN
  Administered 2020-05-05: 3 mL

## 2020-05-05 MED ORDER — PRENATAL MULTIVITAMIN CH
1.0000 | ORAL_TABLET | Freq: Every day | ORAL | Status: DC
  Administered 2020-05-05 – 2020-05-07 (×3): 1 via ORAL
  Filled 2020-05-05 (×3): qty 1

## 2020-05-05 MED ORDER — FENTANYL 2.5 MCG/ML W/ROPIVACAINE 0.15% IN NS 100 ML EPIDURAL (ARMC)
EPIDURAL | Status: AC
  Filled 2020-05-05: qty 100

## 2020-05-05 NOTE — Progress Notes (Signed)
  Labor Progress Note   30 y.o. [id number] @ [redacted]w[redacted]d , admitted for  Pregnancy, Labor Management.   Subjective:  Pt having more intense pain w ctx's  Objective:  BP 108/84 (BP Location: Left Arm)   Pulse 90   Temp 98.6 F (37 C) (Oral)   Resp 20   Ht 5\' 4"  (1.626 m)   Wt 116.1 kg   LMP 11/04/2019 Comment: light and heavy x 3-4 days  BMI 43.94 kg/m  Abd: gravid, ND, FHT present, marked tenderness on exam Extr: trace to 1+ bilateral pedal edema SVE: 3-4/80/-2  EFM: FHR: 140 bpm, variability: moderate,  accelerations:  Present,  decelerations:  Absent Toco: Frequency: Every 2-4 minutes Labs: I have reviewed the patient's lab results.   Assessment & Plan:  05-28-1976 @ [redacted]w[redacted]d, admitted for  Pregnancy and Labor/Delivery Management  1. Pain management: IV sedation and plan epidural. 2. FWB: FHT category 1.  3. ID: GBS pending 4. Labor management: Anticipate delivery Magnesium started for neuroprotection Counseled as to risks of prematurity    Neo aware and will be present Cont ABX  All discussed with patient, see orders  [redacted]w[redacted]d, MD, Annamarie Major Ob/Gyn, [facility] 05/05/2020  2:35 AM

## 2020-05-05 NOTE — Anesthesia Preprocedure Evaluation (Signed)
Anesthesia Evaluation  Patient identified by MRN, date of birth, ID band Patient awake    Reviewed: Allergy & Precautions, H&P , NPO status , Patient's Chart, lab work & pertinent test results, reviewed documented beta blocker date and time   Airway Mallampati: III  TM Distance: >3 FB Neck ROM: full    Dental no notable dental hx. (+) Teeth Intact   Pulmonary neg pulmonary ROS, Current Smoker, former smoker,    Pulmonary exam normal breath sounds clear to auscultation       Cardiovascular Exercise Tolerance: Good hypertension, On Medications negative cardio ROS   Rhythm:regular Rate:Normal     Neuro/Psych negative neurological ROS  negative psych ROS   GI/Hepatic negative GI ROS, Neg liver ROS,   Endo/Other  negative endocrine ROSdiabetes, Gestational  Renal/GU      Musculoskeletal   Abdominal   Peds  Hematology negative hematology ROS (+)   Anesthesia Other Findings   Reproductive/Obstetrics (+) Pregnancy                             Anesthesia Physical Anesthesia Plan  ASA: II  Anesthesia Plan: Epidural   Post-op Pain Management:    Induction:   PONV Risk Score and Plan:   Airway Management Planned:   Additional Equipment:   Intra-op Plan:   Post-operative Plan:   Informed Consent: I have reviewed the patients History and Physical, chart, labs and discussed the procedure including the risks, benefits and alternatives for the proposed anesthesia with the patient or authorized representative who has indicated his/her understanding and acceptance.       Plan Discussed with:   Anesthesia Plan Comments:         Anesthesia Quick Evaluation

## 2020-05-05 NOTE — Discharge Summary (Signed)
Postpartum Discharge Summary  Patient Name: Christina Riggs DOB: 1991/01/03 MRN: [id number]  Date of admission: 05/04/2020 Delivery date:05/05/2020  Delivering provider: Imagene Riches  Date of discharge: 05/07/2020  Admitting diagnosis: Preterm premature rupture of membranes [O42.919] Intrauterine pregnancy: [redacted]w[redacted]d    Secondary diagnosis:  Principal Problem:   Preterm premature rupture of membranes Active Problems:   [redacted] weeks gestation of pregnancy   Preterm labor with delivery   Postpartum care following vaginal delivery  Additional problems: Preterm prmature of membranes    Discharge diagnosis: Preterm Pregnancy Delivered                                              Post partum procedures: none Augmentation: N/A Complications: None  Hospital course: Onset of Labor With Vaginal Delivery      30y.o. yo [id number] [id number] admitted in Latent Labor on 05/04/2020. Patient had an uncomplicated labor course as follows:  Membrane Rupture Time/Date: 11:30 AM ,05/04/2020   Delivery Method:Vaginal, Spontaneous  Episiotomy: None  Lacerations:  None  Patient had an uncomplicated postpartum course.  She is ambulating, tolerating a regular diet, passing flatus, and urinating well. Patient is discharged home in stable condition on 05/07/20.  Newborn Data: Birth date:05/05/2020  Birth time:10:47 AM  Gender:Female  Living status:Living  Apgars:6 ,8  Weight:1810 g   Magnesium Sulfate received: Yes: Neuroprotection BMZ received: No Rhophylac:N/A MMR:No T-DaP:Given postpartum Flu: No Transfusion:No  Physical exam  Vitals:   05/06/20 2017 05/06/20 2340 05/07/20 0300 05/07/20 0838  BP: (!) 106/58 115/76 (!) 108/53 117/71  Pulse: 77 85 88 84  Resp: _0 Temp: 98.4 F (36.9 C) 98.7 F (37.1 C) 98.2 F (36.8 C) 98.1 F (36.7 C)  TempSrc: Oral Oral Oral Oral  SpO2: 98% 97% 98%   Weight:      Height:       General: alert, cooperative and no distress Lochia:  appropriate Uterine Fundus: firm Incision: N/A DVT Evaluation: No evidence of DVT seen on physical exam. No significant calf/ankle edema. Labs: Lab Results  Component Value Date   WBC 9.1 05/06/2020   HGB 9.7 (L) 05/06/2020   HCT 28.0 (L) 05/06/2020   MCV 90.9 05/06/2020   PLT 295 05/06/2020   CMP Latest Ref Rng & Units 05/04/2020  Glucose 70 - 99 mg/dL 94  BUN 6 - 20 mg/dL <5(L)  Creatinine 0.44 - 1.00 mg/dL 0.50  Sodium 135 - 145 mmol/L 136  Potassium 3.5 - 5.1 mmol/L 3.3(L)  Chloride 98 - 111 mmol/L 105  CO2 22 - 32 mmol/L 21(L)  Calcium 8.9 - 10.3 mg/dL 9.0  Total Protein 6.5 - 8.1 g/dL 7.1  Total Bilirubin 0.3 - 1.2 mg/dL 0.6  Alkaline Phos 38 - 126 U/L 90  AST 15 - 41 U/L 18  ALT 0 - 44 U/L 17   Edinburgh Score: Edinburgh Postnatal Depression Scale Screening Tool 05/06/2020  I have been able to laugh and see the funny side of things. 0  I have looked forward with enjoyment to things. 0  I have blamed myself unnecessarily when things went wrong. 2  I have been anxious or worried for no good reason. 2  I have felt scared or panicky for no good reason. 1  Things have been getting on top of me. 2  I have been so unhappy that  I have had difficulty sleeping. 1  I have felt sad or miserable. 1  I have been so unhappy that I have been crying. 1  The thought of harming myself has occurred to me. 0  Edinburgh Postnatal Depression Scale Total 10    After visit meds:  Allergies as of 05/07/2020   No Known Allergies     Medication List    TAKE these medications   acetaminophen 325 MG tablet Commonly known as: TYLENOL Take 650 mg by mouth every 6 (six) hours as needed.   ibuprofen 600 MG tablet Commonly known as: ADVIL Take 1 tablet (600 mg total) by mouth every 6 (six) hours.   prenatal multivitamin Tabs tablet Take 1 tablet by mouth daily at 12 noon.        Discharge home in stable condition Infant Feeding: Bottle Infant Disposition:NICU Discharge  instruction: per After Visit Summary and Postpartum booklet. Activity: Advance as tolerated. Pelvic rest for 6 weeks.  Diet: routine diet Anticipated Birth Control: Unsure  - considering IUD Postpartum Appointment:6 weeks Additional Postpartum F/U: None Future Appointments: Future Appointments  Date Time Provider Reading  05/09/2020 11:30 AM WS-WS [place] 2 WS-IMG None  05/09/2020  1:50 PM Imagene Riches, CNM WS-WS None   Follow up Visit:  Follow-up Information    Imagene Riches, CNM. Schedule an appointment as soon as possible for a visit in 6 week(s).   Specialties: Obstetrics, Gynecology Why: Plaes call the [facility] office and make an appointment for 6 weeks after your delivery. If you plan on an IUD or Nexplanon for contraception, please let the office know. Contact information: [address] [place] [address] [phone]                  05/07/2020 Orlie Pollen, CNM

## 2020-05-05 NOTE — Anesthesia Procedure Notes (Signed)
Epidural Patient location during procedure: OB  Staffing Anesthesiologist: Piscitello, Joseph K, MD Performed: anesthesiologist   Preanesthetic Checklist Completed: patient identified, IV checked, site marked, risks and benefits discussed, surgical consent, monitors and equipment checked, pre-op evaluation and timeout performed  Epidural Patient position: sitting Prep: ChloraPrep Patient monitoring: heart rate, continuous pulse ox and blood pressure Approach: midline Location: L3-L4 Injection technique: LOR saline  Needle:  Needle type: Tuohy  Needle gauge: 17 G Needle length: 9 cm and 9 Needle insertion depth: 7 cm Catheter type: closed end flexible Catheter size: 19 Gauge Catheter at skin depth: 12 cm Test dose: negative and 1.5% lidocaine with Epi 1:200 K  Assessment Sensory level: T10 Events: blood not aspirated, injection not painful, no injection resistance, no paresthesia and negative IV test  Additional Notes 1st attempt Pt. Evaluated and documentation done after procedure finished. Patient identified. Risks/Benefits/Options discussed with patient including but not limited to bleeding, infection, nerve damage, paralysis, failed block, incomplete pain control, headache, blood pressure changes, nausea, vomiting, reactions to medication both or allergic, itching and postpartum back pain. Confirmed with bedside nurse the patient's most recent platelet count. Confirmed with patient that they are not currently taking any anticoagulation, have any bleeding history or any family history of bleeding disorders. Patient expressed understanding and wished to proceed. All questions were answered. Sterile technique was used throughout the entire procedure. Please see nursing notes for vital signs. Test dose was given through epidural catheter and negative prior to continuing to dose epidural or start infusion. Warning signs of high block given to the patient including shortness of  breath, tingling/numbness in hands, complete motor block, or any concerning symptoms with instructions to call for help. Patient was given instructions on fall risk and not to get out of bed. All questions and concerns addressed with instructions to call with any issues or inadequate analgesia.   Patient tolerated the insertion well without immediate complications.Reason for block:procedure for pain     

## 2020-05-05 NOTE — Progress Notes (Signed)
Christina Riggs is a 30 y.o. G3P2002 at [redacted]w[redacted]d by ultrasound admitted for Preterm labor, PROM  Subjective:  She is very comfortable with an epidural. Resting. Is not aware of any pelvic pressure.   Objective: BP (!) 105/59 (BP Location: Left Arm)   Pulse (!) 102   Temp 98.2 F (36.8 C) (Oral)   Resp 15   Ht 5\' 4"  (1.626 m)   Wt 116.1 kg   LMP 11/04/2019 Comment: light and heavy x 3-4 days  SpO2 97% Comment: RA  BMI 43.94 kg/m  I/O last 3 completed shifts: In: 1631.3 [I.V.:1330.5; IV Piggyback:300.9] Out: -  Total I/O In: 203 [I.V.:149.6; Other:53.4] Out: 1800 [Urine:1800]  FHT:  FHR: 125 baseline bpm, variability: moderate,  accelerations:  Abscent,  decelerations:  Present non repetitive "dips" in FHTs with some contractions. difficulty with obtaining continuous tracing secondary to patient's high BMI. Belt adjusted. UC:   regular, every 4-5 minutes SVE:   Dilation: 3.5 Effacement (%): 80 Station: -2 Exam by:: K Cevallos RN  Labs: Lab Results  Component Value Date   WBC 10.0 05/05/2020   HGB 10.6 (L) 05/05/2020   HCT 31.0 (L) 05/05/2020   MCV 89.9 05/05/2020   PLT 272 05/05/2020    Assessment / Plan: PPROM at 30 weeks 2 days, now stable, and continuing on magnesium sulphate for fetal neuroprotection  Labor: latent phase, under expectant management  Fetal Wellbeing:  Category II Pain Control:  Epidural I/D:  n/a Anticipated MOD:  NSVD  NICU aware and will attend the delivery.   Patient encouraged to contact nursing or the provider for any change in pelvic pressure. IV antibiotics conintue for 48 hrs Will refrain from vaginal exams unless delivery appears imminent.  05/07/2020, CNM  05/05/2020 8:44 AM     05/07/2020 Christina Riggs 05/05/2020, 8:37 AM

## 2020-05-06 LAB — CBC
HCT: 28 % — ABNORMAL LOW (ref 36.0–46.0)
Hemoglobin: 9.7 g/dL — ABNORMAL LOW (ref 12.0–15.0)
MCH: 31.5 pg (ref 26.0–34.0)
MCHC: 34.6 g/dL (ref 30.0–36.0)
MCV: 90.9 fL (ref 80.0–100.0)
Platelets: 295 10*3/uL (ref 150–400)
RBC: 3.08 MIL/uL — ABNORMAL LOW (ref 3.87–5.11)
RDW: 13.1 % (ref 11.5–15.5)
WBC: 9.1 10*3/uL (ref 4.0–10.5)
nRBC: 0 % (ref 0.0–0.2)

## 2020-05-06 LAB — CULTURE, BETA STREP (GROUP B ONLY)

## 2020-05-06 MED ORDER — IBUPROFEN 600 MG PO TABS
600.0000 mg | ORAL_TABLET | Freq: Four times a day (QID) | ORAL | Status: DC
  Administered 2020-05-07 (×3): 600 mg via ORAL
  Filled 2020-05-06 (×3): qty 1

## 2020-05-06 NOTE — Progress Notes (Signed)
Post Partum Day 1 Subjective: no complaints, up ad lib, voiding and tolerating PO her baby is in the nursery on CPAP. Christina Riggs is pumping her breasts for her NICU baby  Objective: Blood pressure 124/83, pulse 68, temperature 98.5 F (36.9 C), temperature source Oral, resp. rate 18, height 5\' 4"  (1.626 m), weight 116.1 kg, last menstrual period 11/04/2019, SpO2 99 %, unknown if currently breastfeeding.  Physical Exam:  General: alert, cooperative, no distress and moderately obese Lochia: appropriate Uterine Fundus: firm Incision: healing well, no significant drainage DVT Evaluation: No evidence of DVT seen on physical exam. Negative Homan's sign.  Recent Labs    05/05/20 0604 05/06/20 0656  HGB 10.6* 9.7*  HCT 31.0* 28.0*    Assessment/Plan: Plan for discharge tomorrow and Lactation consult  Continue postpartum orders   LOS: 2 days   05/08/20 05/06/2020, 10:41 AM

## 2020-05-06 NOTE — Plan of Care (Signed)
Alert and oriented with pleasant affect. Color good, skin w&d. Assessment and VS WNL. Denies PIH S/S. Pt. Infant is admitted to SCN and Pt. States that she is going to visit Infant in SCN after her shower. Shower supplies Provided. Pt. Denies c/o and is ambulating with steady gait.

## 2020-05-06 NOTE — Discharge Instructions (Signed)
Postpartum Care After Vaginal Delivery The following information offers guidance about how to care for yourself from the time you deliver your baby to 6-12 weeks after delivery (postpartum period). If you have problems or questions, contact your health care provider for more specific instructions. Follow these instructions at home: Vaginal bleeding  It is normal to have vaginal bleeding (lochia) after delivery. Wear a sanitary pad for bleeding and discharge. ? During the first week after delivery, the amount and appearance of lochia is often similar to a menstrual period. ? Over the next few weeks, it will gradually decrease to a dry, yellow-brown discharge. ? For most women, lochia stops completely by 4-6 weeks after delivery, but can vary.  Change your sanitary pads frequently. Watch for any changes in your flow, such as: ? A sudden increase in volume. ? A change in color. ? Large blood clots.  If you pass a blood clot from your vagina, save it and call your health care provider. Do not flush blood clots down the toilet before talking with your health care provider.  Do not use tampons or douches until your health care provider approves.  If you are not breastfeeding, your period should return 6-8 weeks after delivery. If you are feeding your baby breast milk only, your period may not return until you stop breastfeeding. Perineal care  Keep the area between the vagina and the anus (perineum) clean and dry. Use medicated pads and pain-relieving sprays and creams as directed.  If you had a surgical cut in the perineum (episiotomy) or a tear, check the area for signs of infection until you are healed. Check for: ? More redness, swelling, or pain. ? Fluid or blood coming from the cut or tear. ? Warmth. ? Pus or a bad smell.  You may be given a squirt bottle to use instead of wiping to clean the perineum area after you use the bathroom. Pat the area gently to dry it.  To relieve pain  caused by an episiotomy, a tear, or swollen veins in the anus (hemorrhoids), take a warm sitz bath 2-3 times a day. In a sitz bath, the warm water should only come up to your hips and cover your buttocks.   Breast care  In the first few days after delivery, your breasts may feel heavy, full, and uncomfortable (breast engorgement). Milk may also leak from your breasts. Ask your health care provider about ways to help relieve the discomfort.  If you are breastfeeding: ? Wear a bra that supports your breasts and fits well. Use breast pads to absorb milk that leaks. ? Keep your nipples clean and dry. Apply creams and ointments as told. ? You may have uterine contractions every time you breastfeed for up to several weeks after delivery. This helps your uterus return to its normal size. ? If you have any problems with breastfeeding, notify your health care provider or lactation consultant.  If you are not breastfeeding: ? Avoid touching your breasts. Do not squeeze out (express) milk. Doing this can make your breasts produce more milk. ? Wear a good-fitting bra and use cold packs to help with swelling. Intimacy and sexuality  Ask your health care provider when you can engage in sexual activity. This may depend upon: ? Your risk of infection. ? How fast you are healing. ? Your comfort and desire to engage in sexual activity.  You are able to get pregnant after delivery, even if you have not had your period. Talk with   your health care provider about methods of birth control (contraception) or family planning if you desire future pregnancies. Medicines  Take over-the-counter and prescription medicines only as told by your health care provider.  Take an over-the-counter stool softener to help ease bowel movements as told by your health care provider.  If you were prescribed an antibiotic medicine, take it as told by your health care provider. Do not stop taking the antibiotic even if you start to  feel better.  Review all previous and current prescriptions to check for possible transfer into breast milk. Activity  Gradually return to your normal activities as told by your health care provider.  Rest as much as possible. Nap while your baby is sleeping. Eating and drinking  Drink enough fluid to keep your urine pale yellow.  To help prevent or relieve constipation, eat high-fiber foods every day.  Choose healthy eating to support breastfeeding or weight loss goals.  Take your prenatal vitamins until your health care provider tells you to stop.   General tips/recommendations  Do not use any products that contain nicotine or tobacco. These products include cigarettes, chewing tobacco, and vaping devices, such as e-cigarettes. If you need help quitting, ask your health care provider.  Do not drink alcohol, especially if you are breastfeeding.  Do not take medications or drugs that are not prescribed to you, especially if you are breastfeeding.  Visit your health care provider for a postpartum checkup within the first 3-6 weeks after delivery.  Complete a comprehensive postpartum visit no later than 12 weeks after delivery.  Keep all follow-up visits for you and your baby. Contact a health care provider if:  You feel unusually sad or worried.  Your breasts become red, painful, or hard.  You have a fever or other signs of an infection.  You have bleeding that is soaking through one pad an hour or you have blood clots.  You have a severe headache that doesn't go away or you have vision changes.  You have nausea and vomiting and are unable to eat or drink anything for 24 hours. Get help right away if:  You have chest pain or difficulty breathing.  You have sudden, severe leg pain.  You faint or have a seizure.  You have thoughts about hurting yourself or your baby. If you ever feel like you may hurt yourself or others, or have thoughts about taking your own life,  get help right away. Go to your nearest emergency department or:  Call your local emergency services (911 in the [place]  The National Suicide Prevention Lifeline at 1-800-273-8255. This suicide crisis helpline is open 24 hours a day.  Text the Crisis Text Line at 741741 (in the [place] Summary  The period of time after you deliver your newborn up to 6-12 weeks after delivery is called the postpartum period.  Keep all follow-up visits for you and your baby.  Review all previous and current prescriptions to check for possible transfer into breast milk.  Contact a health care provider if you feel unusually sad or worried during the postpartum period. This information is not intended to replace advice given to you by your health care provider. Make sure you discuss any questions you have with your health care provider. Document Revised: 09/22/2019 Document Reviewed: 09/22/2019 Elsevier Patient Education  2021 [employer] Postpartum Baby Blues The postpartum period begins right after the birth of a baby. During this time, there is often joy and excitement. It is also a   time of many changes in the life of the parents. A mother may feel happy one minute and sad or stressed the next. These feelings of sadness, called the baby blues, usually happen in the period right after the baby is born and go away within a week or two. What are the causes? The exact cause of this condition is not known. Changes in hormone levels after childbirth are believed to trigger some of the symptoms. Other factors that can play a role in these mood changes include:  Lack of sleep.  Stressful life events, such as financial problems, caring for a loved one, or death of a loved one.  Genetics. What are the signs or symptoms? Symptoms of this condition include:  Changes in mood, such as going from extreme happiness to sadness.  A decrease in concentration.  Difficulty sleeping.  Crying spells and  tearfulness.  Loss of appetite.  Irritability.  Anxiety. If these symptoms last for more than 2 weeks or become more severe, you may have postpartum depression. How is this diagnosed? This condition is diagnosed based on an evaluation of your symptoms. Your health care provider may use a screening tool that includes a list of questions to help identify a person with the baby blues or postpartum depression. How is this treated? The baby blues usually go away on their own in 1-2 weeks. Social support is often what is needed. You will be encouraged to get adequate sleep and rest. Follow these instructions at home: Lifestyle  Get as much rest as you can. Take a nap when the baby sleeps.  Exercise regularly as told by your health care provider. Some women find yoga and walking to be helpful.  Eat a balanced and nourishing diet. This includes plenty of fruits and vegetables, whole grains, and lean proteins.  Do little things that you enjoy. Take a bubble bath, read your favorite magazine, or listen to your favorite music.  Avoid alcohol.  Ask for help with household chores, cooking, grocery shopping, or running errands. Do not try to do everything yourself. Consider hiring a postpartum doula to help. This is a professional who specializes in providing support to new mothers.  Try not to make any major life changes during pregnancy or right after giving birth. This can add stress.      General instructions  Talk to people close to you about how you are feeling. Get support from your partner, family members, friends, or other new moms. You may want to join a support group.  Find ways to manage stress. This may include: ? Writing your thoughts and feelings in a journal. ? Spending time outside. ? Spending time with people who make you laugh.  Try to stay positive in how you think. Think about the things you are grateful for.  Take over-the-counter and prescription medicines only as  told by your health care provider.  Let your health care provider know if you have any concerns.  Keep all postpartum visits. This is important. Contact a health care provider if:  Your baby blues do not go away after 2 weeks. Get help right away if:  You have thoughts of taking your own life (suicidal thoughts), or of harming your baby or someone else.  You see or hear things that are not there (hallucinations). If you ever feel like you may hurt yourself or others, or have thoughts about taking your own life, get help right away. Go to your nearest emergency department or:  Call   your local emergency services (911 in the [place]  Call a suicide crisis helpline, such as the National Suicide Prevention Lifeline, at 843-181-8937. This is open 24 hours a day in the [place]  Text the Crisis Text Line at 226-276-3259 (in the [place] Summary  After giving birth, you may feel happy one minute and sad or stressed the next. Feelings of sadness that happen right after the baby is born and go away after a week or two are called the baby blues.  You can manage the baby blues by getting enough rest, eating a healthy diet, exercising, spending time with supportive people, and finding ways to manage stress.  If feelings of sadness and stress last longer than 2 weeks or get in the way of caring for your baby, talk with your health care provider. This may mean you have postpartum depression. This information is not intended to replace advice given to you by your health care provider. Make sure you discuss any questions you have with your health care provider. Document Revised: 07/01/2019 Document Reviewed: 07/01/2019 Elsevier Patient Education  2021 Elsevier Inc. Storing Breast Milk Breast milk has infection-fighting cells (antibodies) in it. Breast milk is a fresh, living food that can go bad if not stored properly. Expressed breast milk, by pumping or by hand, needs to be stored in a certain way so that it  remains effective and safe in helping to protect your baby against infections and to help them grow. The following guidelines are for storing breast milk for a healthy, full-term infant. It is important to wash your hands with soap and water for at least 20 seconds before and after expressing and storing breast milk. If soap and water are not available, use hand sanitizer. What are the risks? There are no risks in storing breastmilk if done according to the guidelines in this document. However, frozen milk, once thawed, can smell different from fresh milk. This does not mean that the milk is bad and cannot be fed to the baby. Many babies will accept this milk just fine. Contact a health care provider or lactation specialist if you notice that your thawed breastmilk smells different and your baby refuses to drink it. How to store breast milk  Milk may be stored in: ? A glass container. ? A hard, BPA-free plastic container. ? A plastic bag designed for storing breast milk. Many women like using plastic bags because they take up less space than other containers and can be attached directly to the breast pump.  Store your milk in 2-4 oz (60-120 mL) servings. This makes it easier to thaw the milk. It also helps you to avoid having to throw out milk that your baby does not drink.  Leave an inch or so (about 2.5 cm) at the top of the bag or bottle so the milk has room to expand as it freezes.  Label each container with the date and time the milk was expressed. This will help you to use the milk in the order that it was expressed. Use the "first in, first out" method when using stored milk.  If you will be freezing your breast milk, freeze it within 24 hours of expressing. Store it in the back of the freezer to prevent the milk from being affected by temperature changes when the freezer door is opened. You do not have to freeze all of your pumped milk. It is best to use the milk fresh, while being stored  in the refrigerator for  up to 4 days.   How long can breast milk be stored?  The amount of time within which expressed breastmilk can be used safely depends on how it is stored.  Milk can be stored for up to 4 hours at room temperature, or 60-62F (15.6-19.4C). However, it is acceptable to allow the milk to sit for 6-8 hours at lower temperatures if the pump parts and containers are well cleaned and you have washed your hands well before expressing breast milk.  Milk can be stored for up to 4 days in a refrigerator at less than 60F (3.9C). However, it is acceptable to allow the milk to be refrigerated for up to 5-8 days if the pump parts and containers are well cleaned. It is best to store milk towards the back of the refrigerator. If you are storing milk at work, it is OK to store it in a shared refrigerator.  Milk can be stored for 2 weeks in a freezer compartment inside a refrigerator.  Milk can be stored for up to 6 months in a freezer compartment of a refrigerator with a separate door.  Milk can be stored for 6-12 months in a deep freezer at -65F (-20C). A deep freezer is a chest or stand-alone freezer that is not opened very often and is kept at a colder temperature than a regular freezer.  Milk left over from a feeding should be used within 2 hours. After 2 hours, throw away any leftover milk in the bottle. How to thaw frozen breast milk  Frozen milk can be thawed: ? In a refrigerator. ? Under warm, running tap water for up to 15 minutes. ? In a pan of warm water that has been heated on the stove. Let it sit for up to 15 minutes. Place the container or bag in the pan of water.  Do not heat milk directly on the stove or in the microwave. The milk will heat unevenly, and it may burn the baby's mouth. It will also destroy some of the milk's infection-fighting properties. Can I refreeze thawed milk? Do not refreeze thawed milk. You can store thawed milk in the refrigerator for up to  24 hours. Can I add freshly pumped milk to frozen milk? If you want to freeze freshly expressed milk along with milk that is already frozen, take these steps to prevent the fresh milk from thawing out the frozen milk:  Chill the fresh milk in the refrigerator for 30 minutes before adding it to the frozen milk.  Make sure that the amount of fresh milk you add is less than the amount of frozen milk. Where to find more information  Center for Disease Control: FootballExhibition.com.brwww.cdc.gov  La Leche League: www.llli.org  American Academy of Pediatrics: www.healthychildren.org Summary  Breast milk has infection-fighting cells (antibodies) in it. Expressed breast milk needs to be stored in a certain way.  The amount of time within which expressed breastmilk can be used safely depends on how it is stored.  Thaw breast milk safely either in the refrigerator, in warm water, or under warm running tap water. Do not heat milk directly on the stove or in the microwave.  Do not refreeze thawed breast milk. Use thawed breast milk stored in the refrigerator within 24 hours. This information is not intended to replace advice given to you by your health care provider. Make sure you discuss any questions you have with your health care provider. Document Revised: 06/28/2019 Document Reviewed: 06/28/2019 Elsevier Patient Education  2021  Elsevier Inc. Breast Pumping Tips Breast pumping is a way to get milk out of your breasts. You will then store the milk for your baby to use when you are away from home. There are three ways to pump.  You can use your hand to massage and squeeze your breast (hand expression).  You can use a hand-held machine to manually pump your milk.  You can use an electric machine to pump your milk. In the beginning you may not get much milk. After a few days, your breasts should make more. Pumping can help you start making milk after your baby is born. Pumping helps you to keep making milk when you  are away from your baby. When should I pump? You can start pumping soon after your baby is born. Follow these tips:  When you are with your baby: ? Pump after you breastfeed. ? Pump from the free breast while you breastfeed.  When you are away from your baby: ? Pump every 2-3 hours for 15 minutes. ? Pump both breasts at the same time if you can.  If your baby drinks formula, pump around the time your baby gets the formula.  If you drank alcohol, wait 2 hours before you pump.  If you are going to have surgery, ask your doctor when you should pump again. How do I get ready to pump? Try to relax. Try these things to help your milk come in:  Smell your baby's blanket or clothes.  Look at a picture or video of your baby.  Sit in a quiet, private space.  Place a cloth on your breast. The cloth should be warm and a little wet.  Massage your breast and nipple.  Play relaxing music.  Picture your milk flowing.  Drink water and eat a snack. What are some tips? General tips for pumping breast milk  Always wash your hands with soap and water for at least 20 seconds before pumping.  If you do not get much milk or if pumping hurts, try different pump settings or a different kind of pump.  Drink enough fluid so your pee (urine) is clear or pale yellow.  Wear clothing that opens in the front or is easy to take off.  Pump milk into a clean bottle or container.  Do not smoke or use any products that contain nicotine or tobacco. If you need help quitting, ask your doctor.  Try to get a hands-free pumping bra, if possible. This makes it easy to pump breast milk. You can buy one or make your own.   Tips for storing breast milk  Store breast milk in a clean, BPA-free container. These include: ? A glass or plastic bottle. ? A milk storage bag.  Store only 2-4 ounces of breast milk in each container.  Swirl the breast milk in the container. Do not shake it.  Write down the date  you pumped the milk on the container.  This is how long you can store breast milk: ? Room temperature: 6-8 hours. It is best to use the milk within 4 hours. ? Cooler with ice packs: 24 hours. ? Refrigerator: 5-8 days, if the milk is clean. It is best to use the milk within 3 days. ? Freezer: 9-12 months, if the milk is clean and stored away from the freezer door. It is best to use the milk within 6 months.  Put milk in the back of the refrigerator or freezer.  Thaw frozen milk using warm  water. Do not use the microwave.   Tips for choosing a breast pump When choosing a pump, keep the following things in mind:  Manual breast pumps do not need electricity. They cost less. They can be hard to use.  Electric breast pumps use electricity. They are more expensive. They are easier to use. They collect more milk.  The suction cup (flange) should be the right size.  Before you buy the pump, check if your insurance will pay for it. Tips for caring for a breast pump  Check the manual that came with your pump for cleaning tips.  Try not to touch the inside of pump parts.  Clean the pump after you use it. To do this: ? Wipe down the electrical part. Use a dry cloth or paper towel. Do not put this part in water or in cleaning products. ? Wash the plastic parts with soap and warm water. Or use the dishwasher if the manual says it is safe. You do not need to clean the tubing unless it touched breast milk. ? Let all the parts air dry. Avoid drying them with a cloth or towel. ? When the parts are clean and dry, put the pump back together. Then store the pump.  If there is water in the tubing when you want to pump: 1. Attach the tubing to the pump. 2. Turn on the pump to dry the tubing. 3. Turn off the pump when the tube is dry. Summary  Pumping can help you start making milk after your baby is born. It lets you keep making milk when you are away from your baby.  When you are away from your  baby, pump for about 15 minutes every 2-3 hours. Pump both breasts at the same time, if you can. This information is not intended to replace advice given to you by your health care provider. Make sure you discuss any questions you have with your health care provider. Document Revised: 10/18/2019 Document Reviewed: 10/18/2019 Elsevier Patient Education  2021 [employer].

## 2020-05-06 NOTE — Lactation Note (Signed)
This note was copied from a baby's chart. Lactation Consultation Note  Patient Name: Christina Riggs UKGUR'K Date: 05/06/2020 Reason for consult: Initial assessment;NICU baby;Infant < 6lbs;Preterm <34wks Age:30 hours Lactation to the room to discuss pumping and hand expression. LC taught how to set up and wash equipment, taught proper hand expression technique, labeling and milk storage guidelines. [facility] # on board, Mother has no further questions at this time.   Maternal Data Has patient been taught Hand Expression?: Yes Does the patient have breastfeeding experience prior to this delivery?: Yes How long did the patient breastfeed?: 6 months  Feeding Mother's Current Feeding Choice: Breast Milk and Donor Milk  Lactation Tools Discussed/Used Tools: Pump;15F feeding tube / Syringe Breast pump type: Double-Electric Breast Pump Pump Education: Milk Storage;Setup, frequency, and cleaning Reason for Pumping: NICU admission and preemie Pumping frequency: encourgaed 8x's/24 hours Pumped volume:  (drops)  Interventions Interventions: Breast feeding basics reviewed;Hand express;DEBP;Education  Discharge Pump: Personal (has a pump at home) [facility] Program: Yes  Consult Status Consult Status: Follow-up Date: 05/07/20 Follow-up type: Call as needed    Auryn Paige D Margaret Staggs 05/06/2020, 2:27 PM

## 2020-05-06 NOTE — Anesthesia Postprocedure Evaluation (Signed)
Anesthesia Post Note  Patient: Christina Riggs  Procedure(s) Performed: AN AD HOC LABOR EPIDURAL  Patient location during evaluation: Mother Baby Anesthesia Type: Epidural Level of consciousness: awake and alert Pain management: pain level controlled Vital Signs Assessment: post-procedure vital signs reviewed and stable Respiratory status: spontaneous breathing, nonlabored ventilation and respiratory function stable Cardiovascular status: stable Postop Assessment: no headache, no backache and epidural receding Anesthetic complications: no   No complications documented.   Last Vitals:  Vitals:   05/05/20 2305 05/06/20 0825  BP: (!) 129/92 124/83  Pulse: 91 68  Resp: 18 18  Temp: 36.8 C 36.9 C  SpO2: 97% 99%    Last Pain:  Vitals:   05/06/20 0830  TempSrc:   PainSc: 0-No pain                 Corinda Gubler

## 2020-05-07 MED ORDER — IBUPROFEN 600 MG PO TABS: 600.0000 mg | ORAL_TABLET | Freq: Four times a day (QID) | ORAL | 0 refills | Status: DC

## 2020-05-07 NOTE — TOC Initial Note (Signed)
Transition of Care [facility]) - Initial/Assessment Note    Patient Details  Name: Christina Riggs MRN: [id number] Date of Birth: 1990-04-16  Transition of Care [facility]) CM/SW Contact:    [place] Cellar, RN Phone Number: 05/07/2020, 12:41 PM  Clinical Narrative:                 Spoke with patient while she was visiting with infant in SCN. Patient states her previous son was in SCN for about a week but she has never had a child born so early. Reports the staff have been great with answering her questions and reassuring her. Patient states she is attempting to pump breastmilk although she is concerned about the limited quantity. Has discussed with LC desire to pump and process. Other children at home are 10 and 7. Patient reports she has all needed equipment and has no needs or concerns. No concerns for transportation and has discussed PPD and made a plan with her MD due to having PPD with last pregnancy. Will continue to follow as needed while infant remains in SCN.         Patient Goals and CMS Choice        Expected Discharge Plan and Services           Expected Discharge Date: 05/07/20                                    Prior Living Arrangements/Services                       Activities of Daily Living Home Assistive Devices/Equipment: None ADL Screening (condition at time of admission) Patient's cognitive ability adequate to safely complete daily activities?: Yes Is the patient deaf or have difficulty hearing?: No Does the patient have difficulty seeing, even when wearing glasses/contacts?: No Does the patient have difficulty concentrating, remembering, or making decisions?: No Patient able to express need for assistance with ADLs?: Yes Does the patient have difficulty dressing or bathing?: No Independently performs ADLs?: Yes (appropriate for developmental age) Does the patient have difficulty walking or climbing stairs?: No Weakness of Legs: None Weakness of  Arms/Hands: None  Permission Sought/Granted                  Emotional Assessment              Admission diagnosis:  Preterm premature rupture of membranes [O42.919] Patient Active Problem List   Diagnosis Date Noted  . Preterm labor with delivery 05/05/2020  . Postpartum care following vaginal delivery 05/05/2020  . Preterm premature rupture of membranes 05/04/2020  . [redacted] weeks gestation of pregnancy 05/04/2020  . Supervision of high risk pregnancy in first trimester 02/02/2020   PCP:  Patient, No Pcp Per (Inactive) Pharmacy:   [id number] [facility], [place] - [address] [place] [address] Phone: [phone] Fax: [phone]     Social Determinants of Health (SDOH) Interventions    Readmission Risk Interventions No flowsheet data found.

## 2020-05-07 NOTE — TOC Initial Note (Addendum)
Transition of Care [facility]) - Initial/Assessment Note    Patient Details  Name: Christina Riggs MRN: [id number] Date of Birth: 1990/11/04  Transition of Care [facility]) CM/SW Contact:    [place] Cellar, RN Phone Number: 05/07/2020, 10:17 AM  Clinical Narrative:                  Attempted to contact patient for assessment. Patient currently visiting with infant in SCN. Will attempt to visit at later time to discuss Edinburgh score.   LVMM for patients cell phone to call back.        Patient Goals and CMS Choice        Expected Discharge Plan and Services                                                Prior Living Arrangements/Services                       Activities of Daily Living Home Assistive Devices/Equipment: None ADL Screening (condition at time of admission) Patient's cognitive ability adequate to safely complete daily activities?: Yes Is the patient deaf or have difficulty hearing?: No Does the patient have difficulty seeing, even when wearing glasses/contacts?: No Does the patient have difficulty concentrating, remembering, or making decisions?: No Patient able to express need for assistance with ADLs?: Yes Does the patient have difficulty dressing or bathing?: No Independently performs ADLs?: Yes (appropriate for developmental age) Does the patient have difficulty walking or climbing stairs?: No Weakness of Legs: None Weakness of Arms/Hands: None  Permission Sought/Granted                  Emotional Assessment              Admission diagnosis:  Preterm premature rupture of membranes [O42.919] Patient Active Problem List   Diagnosis Date Noted  . Preterm labor with delivery 05/05/2020  . Postpartum care following vaginal delivery 05/05/2020  . Preterm premature rupture of membranes 05/04/2020  . [redacted] weeks gestation of pregnancy 05/04/2020  . Pregnancy 04/28/2020  . Vaginal discharge during pregnancy in third trimester 04/28/2020   . Supervision of high risk pregnancy in first trimester 02/02/2020  . Obesity affecting pregnancy in first trimester 02/02/2020   PCP:  Patient, No Pcp Per (Inactive) Pharmacy:   [id number] [place] [facility], [place] - [address] [place] [address] Phone: [phone] Fax: [phone]     Social Determinants of Health (SDOH) Interventions    Readmission Risk Interventions No flowsheet data found.

## 2020-05-07 NOTE — Lactation Note (Signed)
This note was copied from a baby's chart. Lactation Consultation Note  Patient Name: Christina Riggs CLEXN'T Date: 05/07/2020 Reason for consult: Follow-up assessment;NICU baby;Infant < 6lbs;Preterm <34wks Age:31 hours   LC Student Thaison Kolodziejski arrived in the room where MOB and FOB were present. MOB just finished pumping.She is currently using a Dr.Brown pump. She stated she has breastfeed her other two kids for 6 or 7 months. She said pumping had been going well but she is concerned because she is not getting a lot out. [facility] Student informed her about supply and demand, information on preterm babies, and size of an infant's stomach in order to provide reassurance. MOB stated she understood and did not have any further questions. [facility] Student informed her if she had any further questions she can reach out for assistance.   Maternal Data Has patient been taught Hand Expression?: Yes Does the patient have breastfeeding experience prior to this delivery?: Yes  Feeding Mother's Current Feeding Choice: Breast Milk and Donor Milk  Discharge Pump: Personal  Consult Status Consult Status: Follow-up Date: 05/08/20    Meagan Spease Katrinka Blazing 05/07/2020, 10:26 AM

## 2020-05-08 ENCOUNTER — Encounter: Payer: Medicaid Other | Admitting: Obstetrics

## 2020-05-08 LAB — SURGICAL PATHOLOGY

## 2020-05-09 ENCOUNTER — Ambulatory Visit: Payer: Medicaid Other

## 2020-05-09 ENCOUNTER — Encounter: Payer: Medicaid Other | Admitting: Obstetrics

## 2020-05-09 DIAGNOSIS — O0992 Supervision of high risk pregnancy, unspecified, second trimester: Secondary | ICD-10-CM

## 2020-05-09 DIAGNOSIS — Z3A26 26 weeks gestation of pregnancy: Secondary | ICD-10-CM

## 2020-05-12 ENCOUNTER — Ambulatory Visit: Payer: Self-pay

## 2020-05-12 NOTE — Lactation Note (Addendum)
This note was copied from a baby's chart. Lactation Consultation Note  Patient Name: Christina Riggs TYOMA'Y Date: 05/12/2020   Age:31 days  Baby was born at 30.3 weeks and today is 31.2 weeks, so is obviously not ready to go to the breast.  He is getting 36 ml via tube feeding. Mom is pumping around every 3 hours 30 minutes on each breast and expressing approximately 3 oz from each breast using a Dr. Theora Gianotti Pump.  Pump seems to be working well for mom, but discussed getting WIC pump if started having problems.  Praised mom for her commitment to continue to supply breast milk for her baby in SCN.  Mom reports breast feeding her other son and daughter around 7 months.  Discussed supply and demand and encouraged mom to call with any questions, concerns or assistance. Maternal Data    Feeding    LATCH Score                    Lactation Tools Discussed/Used Tools: 78F feeding tube / Syringe  Interventions    Discharge    Consult Status      Christina Riggs 05/12/2020, 5:24 PM

## 2020-05-17 ENCOUNTER — Telehealth: Payer: Self-pay

## 2020-05-17 NOTE — Telephone Encounter (Signed)
FMLA/DISABILITY forms (2) for [employer] - one for pt and one for FOB Devanta Simmons, filled out, signature obtained and given to [facility] for processing.

## 2020-05-18 ENCOUNTER — Telehealth: Payer: Self-pay

## 2020-05-18 NOTE — Telephone Encounter (Signed)
Pt calling; states her FMLA people received hers but not her boyfriends.  [phone] Adv they were filled out yesterday; waiting on a signature then they will be faxed.

## 2020-05-21 ENCOUNTER — Telehealth: Payer: Self-pay

## 2020-05-21 NOTE — Telephone Encounter (Signed)
Pt calling to f/u on her boyfriend's FMLA; his work has not recv'd it yet.  [phone]

## 2020-05-21 NOTE — Telephone Encounter (Signed)
Please let her know I faxed and got confirmation, I will refax now

## 2020-05-21 NOTE — Telephone Encounter (Signed)
Pt aware.

## 2020-05-22 ENCOUNTER — Ambulatory Visit: Payer: Medicaid Other | Admitting: Obstetrics and Gynecology

## 2020-05-23 ENCOUNTER — Other Ambulatory Visit: Payer: Self-pay

## 2020-05-23 ENCOUNTER — Encounter: Payer: Self-pay | Admitting: Obstetrics and Gynecology

## 2020-05-23 ENCOUNTER — Ambulatory Visit (INDEPENDENT_AMBULATORY_CARE_PROVIDER_SITE_OTHER): Payer: Medicaid Other | Admitting: Obstetrics and Gynecology

## 2020-05-23 DIAGNOSIS — Z1332 Encounter for screening for maternal depression: Secondary | ICD-10-CM

## 2020-05-23 NOTE — Patient Instructions (Signed)
Below is a list of resources that you may find helpful. Please tell someone if you begin to have suicidal or homicidal thoughts and seek immediate medical help. Staying Safe:    National Suicide Prevention Lifeline: 812 719 3261 (TALK)   Please refrain from using alcohol or illicit substances, such as marijuana, cocaine, etc, as these can affect your mood and could cause anxiety or other concerning symptoms.   REMOVE ALL firearms from your home. Lock medications up.   Seek further medical care for any increase in symptoms or new symptoms, such as thoughts of wanting to hurt yourself, hurt others, or if you have increased agitation.   For immediate concerns, call 911 for emergency medical attention.   Do not make changes to your medications, including taking more or less than prescribed, unless under the supervision of your provider. Be aware that some medications may make you feel worse if abruptly stopped.  ++LOCAL WALK-IN CLINICS and 24/7 CRISIS CENTER++  [facility], 24/7 Address: [address] [place], [place] [address]  Phone: [phone]  ++[place]++ --[facility], 24/7 [address], [place]  (24 hours a day)  --[place] Outreach: Psychiatric Urgent Care  Ages 4 and over with psychiatric or substance use crisis [address] [place], [place] [address] Monday - Thursday: 8:00am - 7:00pm; Friday: 8:00am - 3:00pm  Saturday: 9:00am - 12:00pm, CLOSED Sundays [place], medicaid from [place], [place], [place], [place]  ++[place]++ Crisis and Assessment, 24/7 [facility] Health Care at [facility] [address] [place]  (24 hours a day)  ++[place] [facility] Mental [facility] Division, [place]el. Health Department [address] [place], [place]  (Monday-Friday, 8:00am-5:00pm)  ++[place]++ Community Mental Health Center at [facility] [address]  [place]  (7 days a week, 8:00am-10:00pm)  ++MOBILE CRISIS ++ The Mobile Crisis Response Team provides a number of integrated services including: . Evaluation of immediate need for crisis/emergency services . Crisis intervention and assistance in de-escalating the situation . Risk assessment and evaluation for more intensive services  . Short-term crisis management until other supports/services are in place  . Liaison between individual, families, providers and emergency services  . Coordination of appropriate services for ongoing treatment and follow up  LOCAL MOBILE CRISIS TEAMS:  1) [place] via [facility] [phone]  2) [place], [place], [place] and [facility] Access by [employer]: [phone]  2) [place] via Psycho-therapeutic [phone]  Suicide & Crisis Hotlines  The Hopeline   Call or text: (928) 199-8128  Online chat: www.hopeline.com  [employer]: 1-800-SUICIDE / 4242594149  National Suicide Prevention Lifeline 249-231-1422 / 623 315 4804  Online chat: www.suicidepreventionlifeline.org  Substance Abuse Treatment  Alcohol/Drug Council of [place] 5-277-824-2353  http://[email or web address]://www.[email or web address]   Substance Abuse and Mental Health Services Administration [facility]) treatment locator SurvivorMart.com.pt.aspx   [facility]'s Alcohol and Substance Abuse Program (ASAP) All major health insurance, Medicare, and Medicaid are accepted (307)474-0289 www.[facility].org/uncmc/care-treatment/alcohol-and-substance-abuse/  [facility]: 939-428-9685  http://freedomhouserecovery.org/   [facility] Response Center: 934-232-5603  Mobile Crisis in [place]: https://www.hunter.org/ -- search for your county  [facility] DEPARTMENT OF PSYCHIATRY: https://www.ward.com/  [facility] Psychiatry: The Acute Diagnostic and Treatment Clinic  (ADTC) is a general adult psychiatric clinic staffed by teams of faculty and resident psychiatrists. For appointments, please call [phone].   [address] - [place] Child & Adolescent Psychiatry: To obtain an appointment for your child to be seen in our clinic, please call [phone].   [facility]  Campus - [facility] Child Psychiatry Outpatient Program, [facility] Psychiatry Pediatric Behavior Clinic, [facility] Faculty Physicians and [facility] Parenting Initiative Clinic: To obtain an appointment at one of these clinics, please call [phone].   [facility] for Eating Disorders: [phone]   [facility] for Women's Mood Disorders: 4805924117  How to Find Mental Health Treatment If you have health insurance, look on your insurance card for the phone number or website to access a list of mental health providers who accept your insurance.  If you have Medicaid, Medicare or no insurance, see below for your county's MCO. Call the access number to schedule an appointment.   For the most up-to-date information, please visit: http://www.[email or web address]/  [facility] served [employer] [place] 24/7 Access: [phone]  www.[email or web address].Physicist, medical [place] [facility] [place] Alvie [place] 24/7 Access:  [phone]  www.[email or web address].[facility] [place] [facility] [facility] [place]lon Hand[place] 24/7 Access: [phone]  www.[email or web address].com  [facility] [place]  Access: [phone]  TTY: [phone]  www.[email or web address].net  [employer] Health Management [facility] [place] Francesco [place] 24/7 Access: [phone]  www.[email or web address]  [facility] [place] [address] 24/7 Access: [phone]  www.[email or web address]  [facility] Health Resources [place] Hilma [place] [facility] [place] 24/7 Access: [phone]  www.[email or web address]

## 2020-05-23 NOTE — Progress Notes (Signed)
Postpartum Visit  Chief Complaint:  Chief Complaint  Patient presents with  . Postpartum Care    History of Present Illness: Patient is a 30 y.o. [id number] presents for postpartum visit. She reports recent symptoms of depression. Denies SI, HI. Patient reports that infant remains in SCN. Has been visiting regularly, is exclusively pumping breast milk for infant. She reports a significant amount of stress prior to delivery which has increased since delivery of preterm infant.  Date of delivery: 05/05/20 Type of delivery: Vaginal delivery - Vacuum or forceps assisted  no Episiotomy No.  Laceration: no  Pregnancy or labor problems:  Yes - PPROM and preterm delivery Any problems since the delivery:  Yes - reports difficulties with mood - reports she has good support  Newborn Details:  SINGLETON :  1. BabyGender female. Birth weight:   1810 g Maternal Details:  Breast or formula feeding: exclusively pumping breast milk Intercourse: No  Contraception after delivery: Desires IUD at six week visit Any bowel or bladder issues: No  Post partum depression/anxiety noted:  yes Edinburgh Post-Partum Depression Score: 12  Review of Systems: Review of Systems  Constitutional: Negative.   HENT: Negative.   Eyes: Negative.   Respiratory: Negative.   Cardiovascular: Negative.   Gastrointestinal: Negative.   Genitourinary: Negative.   Musculoskeletal: Negative.   Skin: Negative.   Neurological: Negative.   Endo/Heme/Allergies: Negative.   Psychiatric/Behavioral: Positive for depression. Negative for suicidal ideas. The patient is nervous/anxious.     The following portions of the patient's history were reviewed and updated as appropriate: allergies, current medications, past family history, past medical history, past social history, past surgical history and problem list.  Past Medical History:  Past Medical History:  Diagnosis Date  . Fracture of foot    Right foot fracture 10/2019   . Hypertension    per pt report    Past Surgical History:  Past Surgical History:  Procedure Laterality Date  . NO PAST SURGERIES      Family History:  Family History  Problem Relation Age of Onset  . Hypertension Father   . Stroke Father     Social History:  Social History   Socioeconomic History  . Marital status: Single    Spouse name: Not on file  . Number of children: Not on file  . Years of education: Not on file  . Highest education level: Not on file  Occupational History  . Not on file  Tobacco Use  . Smoking status: Former Smoker    Years: 2.00    Types: Cigars  . Smokeless tobacco: Never Used  . Tobacco comment: 1  day  Vaping Use  . Vaping Use: Never used  Substance and Sexual Activity  . Alcohol use: Not Currently    Comment: last use 07/2019  . Drug use: No  . Sexual activity: Yes    Comment: no bcm since IUD removed 08/2019  Other Topics Concern  . Not on file  Social History Narrative  . Not on file   Social Determinants of Health   Financial Resource Strain: Not on file  Food Insecurity: Not on file  Transportation Needs: Not on file  Physical Activity: Not on file  Stress: Not on file  Social Connections: Not on file  Intimate Partner Violence: Unknown  . Fear of Current or Ex-Partner: Not on file  . Emotionally Abused: No  . Physically Abused: No  . Sexually Abused: No    Allergies:  No  Known Allergies  Medications: Prior to Admission medications   Medication Sig Start Date End Date Taking? Authorizing Provider  acetaminophen (TYLENOL) 325 MG tablet Take 650 mg by mouth every 6 (six) hours as needed.    [provider]  ibuprofen (ADVIL) 600 MG tablet Take 1 tablet (600 mg total) by mouth every 6 (six) hours. 05/07/20   Zipporah Plants, CNM  Prenatal Vit-Fe Fumarate-FA (PRENATAL MULTIVITAMIN) TABS tablet Take 1 tablet by mouth daily at 12 noon.    [provider]    Physical Exam Blood pressure 126/84, height  5\' 4"  (1.626 m), weight 256 lb (116.1 kg), unknown if currently breastfeeding.   General: NAD Pulmonary: No increased work of breathing Extremities: no edema, erythema, or tenderness Neurologic: Grossly intact Psychiatric: mood appropriate, affect full   Edinburgh Postnatal Depression Scale - 05/23/20 1438      Edinburgh Postnatal Depression Scale:  In the Past 7 Days   I have been able to laugh and see the funny side of things. 1    I have looked forward with enjoyment to things. 0    I have blamed myself unnecessarily when things went wrong. 2    I have been anxious or worried for no good reason. 2    I have felt scared or panicky for no good reason. 2    Things have been getting on top of me. 2    I have been so unhappy that I have had difficulty sleeping. 1    I have felt sad or miserable. 1    I have been so unhappy that I have been crying. 1    The thought of harming myself has occurred to me. 0    Edinburgh Postnatal Depression Scale Total 12           Assessment: 30 y.o. [phone] presenting for 2 week postpartum mood check  Plan: Problem List Items Addressed This Visit      Other   Postpartum care following vaginal delivery - Primary    Other Visit Diagnoses    Encounter for screening for maternal depression         1) Two week PP visit to screen for maternal depression - Edinburgh score elevated at today's visit. Reviewed support system and coping strategies with patient. Discussed treatment options including initiation of SSRI - patient declined this therapy. Provided resources for mental healthcare in [place].  2) Plans for IUD placement at 6 week PP visit  3) Return in about 4 weeks (around 06/20/2020) for Routine postpartum visit with M. 08/20/2020.   Eunice Blase, CNM, MSN [facility], [facility] 05/23/2020, 2:42 PM

## 2020-05-24 ENCOUNTER — Ambulatory Visit: Payer: Self-pay

## 2020-05-24 NOTE — Lactation Note (Signed)
This note was copied from a baby's chart. Lactation Consultation Note  Patient Name: Christina Riggs SHFWY'O Date: 05/24/2020 Reason for consult: Follow-up assessment;Preterm <34wks;Infant < 6lbs Age:31 wk.o.  Lactation at bedside for attempted feeding at the breast. Mom has very large breast with large breast milk supply. Mom pumped 1hr ago.  LC assisted with a semi-reclined position, pillow support at left breast with nipple shield (size 53mm). Baby was placed side-lying at the breast with head higher than legs, turned in towards breast. Baby grasped the breast with the nipple shield.  Baby accepted shield well, but was not overly eager. Some swallows was noted, but could be based on moms leaking into shield not necessarily from effort of baby. Baby remained relaxed and on/off sucking pattern for 10 minutes.  Baby moved up to mom's chest, remained sleepy and content.  Praised mom for continued efforts to maintain supply and for efforts to feed baby at the breast.  Encouraged to seek support from lactation with lick and learns and feeding attempts at the breast.  Maternal Data Has patient been taught Hand Expression?: Yes  Feeding Mother's Current Feeding Choice: Breast Milk  LATCH Score Latch: Repeated attempts needed to sustain latch, nipple held in mouth throughout feeding, stimulation needed to elicit sucking reflex.  Audible Swallowing: A few with stimulation  Type of Nipple: Everted at rest and after stimulation  Comfort (Breast/Nipple): Soft / non-tender  Hold (Positioning): Assistance needed to correctly position infant at breast and maintain latch.  LATCH Score: 7   Lactation Tools Discussed/Used Tools: 40F feeding tube / Syringe Nipple shield size: 20 Breast pump type: Double-Electric Breast Pump Reason for Pumping: SCN; preterm Pumping frequency: q3hrs  Interventions Interventions: Breast feeding basics reviewed;Assisted with latch;Hand express;Breast  compression;Adjust position;Support pillows;Position options;Education  Discharge    Consult Status Consult Status: PRN    Danford Bad 05/24/2020, 2:40 PM

## 2020-05-28 ENCOUNTER — Ambulatory Visit: Payer: Self-pay

## 2020-05-28 NOTE — Lactation Note (Signed)
This note was copied from a baby's chart. Lactation Consultation Note  Patient Name: Christina Riggs JGGEZ'M Date: 05/28/2020   Age:31 wk.o.  Mom has not visited today.  Mom is not presently breast feeding d/t being placed NPO, getting IV antibiotics and oxygen last Thursday.  Nasal canula was d/c today at 11 am and may resume breast feeding again soon.  Mom has large breasts and will require lactation assistance when she starts going back to the breast.   Maternal Data    Feeding    LATCH Score                    Lactation Tools Discussed/Used    Interventions    Discharge    Consult Status      Louis Meckel 05/28/2020, 5:14 PM

## 2020-06-20 ENCOUNTER — Ambulatory Visit: Payer: Medicaid Other | Admitting: Obstetrics

## 2020-07-09 ENCOUNTER — Ambulatory Visit: Payer: Medicaid Other | Admitting: Obstetrics

## 2020-07-19 ENCOUNTER — Ambulatory Visit: Payer: Medicaid Other | Admitting: Obstetrics

## 2020-07-24 ENCOUNTER — Ambulatory Visit: Payer: Medicaid Other | Admitting: Obstetrics

## 2020-08-06 ENCOUNTER — Encounter: Payer: Self-pay | Admitting: Obstetrics

## 2020-08-06 ENCOUNTER — Other Ambulatory Visit: Payer: Self-pay

## 2020-08-06 ENCOUNTER — Ambulatory Visit (INDEPENDENT_AMBULATORY_CARE_PROVIDER_SITE_OTHER): Payer: Medicaid Other | Admitting: Obstetrics

## 2020-08-06 VITALS — BP 132/78 | Ht 64.0 in | Wt 266.0 lb

## 2020-08-06 DIAGNOSIS — Z975 Presence of (intrauterine) contraceptive device: Secondary | ICD-10-CM

## 2020-08-06 DIAGNOSIS — O99345 Other mental disorders complicating the puerperium: Secondary | ICD-10-CM | POA: Diagnosis not present

## 2020-08-06 DIAGNOSIS — F53 Postpartum depression: Secondary | ICD-10-CM | POA: Diagnosis not present

## 2020-08-06 LAB — POCT URINE PREGNANCY: Preg Test, Ur: NEGATIVE

## 2020-08-06 MED ORDER — MISOPROSTOL 200 MCG PO TABS: 200.0000 ug | ORAL_TABLET | Freq: Once | ORAL | 0 refills | Status: DC

## 2020-08-06 MED ORDER — SERTRALINE HCL 50 MG PO TABS: 50.0000 mg | ORAL_TABLET | Freq: Every day | ORAL | 2 refills | Status: DC

## 2020-08-06 NOTE — Progress Notes (Signed)
Postpartum Visit  Chief Complaint:  Chief Complaint  Patient presents with   Post-op Follow-up    6 wk postpartum - no concerns. Christina Riggs 3    History of Present Illness: Patient is a 30 y.o. [name] presents for postpartum visit.  Date of delivery: 05/05/2020 Type of delivery: Vaginal delivery - Vacuum or forceps assisted  no Episiotomy No.  Laceration: no  Pregnancy or labor problems:  yesPPROM Any problems since the delivery:  no  Newborn Details:  SINGLETON :  1. Christina Riggs's name: female. Birth weight: 3lbs 15 oz Maternal Details:  Breast Feeding:  yes Post partum depression/anxiety noted:  yes Edinburgh Post-Partum Depression Score:  19  Date of last PAP:08/25/19  abnormal. ASCUS- needs repeat  Past Medical History:  Diagnosis Date   Fracture of foot    Right foot fracture 10/2019   Hypertension    per pt report    Past Surgical History:  Procedure Laterality Date   NO PAST SURGERIES      Prior to Admission medications   Medication Sig Start Date End Date Taking? Authorizing Provider  acetaminophen (TYLENOL) 325 MG tablet Take 650 mg by mouth every 6 (six) hours as needed.    [provider]  ibuprofen (ADVIL) 600 MG tablet Take 1 tablet (600 mg total) by mouth every 6 (six) hours. 05/07/20   Zipporah Plants, CNM  Prenatal Vit-Fe Fumarate-FA (PRENATAL MULTIVITAMIN) TABS tablet Take 1 tablet by mouth daily at 12 noon.    [provider]    No Known Allergies   Social History   Socioeconomic History   Marital status: Single    Spouse name: Not on file   Number of children: Not on file   Years of education: Not on file   Highest education level: Not on file  Occupational History   Not on file  Tobacco Use   Smoking status: Former    Types: Cigars   Smokeless tobacco: Never   Tobacco comments:    1  day  Vaping Use   Vaping Use: Never used  Substance and Sexual Activity   Alcohol use: Not Currently    Comment: last use 07/2019   Drug use: No    Sexual activity: Yes    Comment: no bcm since IUD removed 08/2019  Other Topics Concern   Not on file  Social History Narrative   Not on file   Social Determinants of Health   Financial Resource Strain: Not on file  Food Insecurity: Not on file  Transportation Needs: Not on file  Physical Activity: Not on file  Stress: Not on file  Social Connections: Not on file  Intimate Partner Violence: Unknown   Fear of Current or Ex-Partner: Not on file   Emotionally Abused: No   Physically Abused: No   Sexually Abused: No    Family History  Problem Relation Age of Onset   Hypertension Father    Stroke Father     ROS   Physical Exam BP 132/78   Ht 5\' 4"  (1.626 m)   Wt 266 lb (120.7 kg)   BMI 45.66 kg/m   OBGyn Exam   Female Chaperone present during breast and/or pelvic exam.  Assessment: 30 y.o. 38 presenting for 6 week postpartum visit. She missed her previously scheduled 6 wk PP visit and is now 12 weeks PP. Sh is breast feeding, and also c/o depressive symptoms. She would like an IUD placed.  Plan: Problem List Items Addressed This Visit  Other   Postpartum care following vaginal delivery   Other Visit Diagnoses     Contraception, device intrauterine    -  Primary   Relevant Orders   POCT urine pregnancy (Completed)        1) Contraception Education given regarding options for contraception, including IUD placement. Due to her being late for this appointment will set for 1-2 weeks out Cytotec RX ordred for her to take the night before.  2)  Pap - ASCCP guidelines and rational discussed.  Patient opts for annual screening interval We will do another pap on her at her IUD placement. 3) Patient underwent screening for postpartum depression with definite concerns noted. Discussed a trial of SSRI medication and she is amenable. I will start her on Zoloft 50 mg po q HS. Careful review of the benefits and side effects of SSRIs. She will start this and we  will review how she is doing at her IUD appointment in 2 weeks. Advised her not to stop this med abruptly if she is on it for more than several weeks.   4) Follow up 2 weeks for IUD placement and another pap smear and to review the Zoloft.  [name], CNM  08/06/2020 12:38 PM   08/06/2020 12:24 PM

## 2020-08-20 ENCOUNTER — Other Ambulatory Visit: Payer: Self-pay | Admitting: Obstetrics

## 2020-08-20 DIAGNOSIS — O99345 Other mental disorders complicating the puerperium: Secondary | ICD-10-CM

## 2020-08-20 DIAGNOSIS — F53 Postpartum depression: Secondary | ICD-10-CM

## 2020-08-23 ENCOUNTER — Ambulatory Visit: Payer: Medicaid Other | Admitting: Obstetrics

## 2020-08-24 ENCOUNTER — Telehealth: Payer: Self-pay

## 2020-08-24 NOTE — Telephone Encounter (Signed)
FMLA/DISABILITY for [employer] filled out.  Needs signature, forms, pmt, fax.

## 2020-09-05 ENCOUNTER — Other Ambulatory Visit: Payer: Self-pay | Admitting: Obstetrics

## 2020-09-05 DIAGNOSIS — O99345 Other mental disorders complicating the puerperium: Secondary | ICD-10-CM

## 2020-09-05 DIAGNOSIS — F53 Postpartum depression: Secondary | ICD-10-CM

## 2020-09-20 ENCOUNTER — Ambulatory Visit (INDEPENDENT_AMBULATORY_CARE_PROVIDER_SITE_OTHER): Payer: Medicaid Other | Admitting: Obstetrics

## 2020-09-20 ENCOUNTER — Other Ambulatory Visit (HOSPITAL_COMMUNITY)
Admission: RE | Admit: 2020-09-20 | Discharge: 2020-09-20 | Disposition: A | Discharge status: Home / Self Care | Payer: Medicaid Other | Source: Ambulatory Visit | Attending: Obstetrics | Admitting: Obstetrics

## 2020-09-20 ENCOUNTER — Other Ambulatory Visit: Payer: Self-pay

## 2020-09-20 VITALS — BP 130/80 | Ht 64.0 in | Wt 261.0 lb

## 2020-09-20 DIAGNOSIS — Z30430 Encounter for insertion of intrauterine contraceptive device: Secondary | ICD-10-CM

## 2020-09-20 DIAGNOSIS — Z124 Encounter for screening for malignant neoplasm of cervix: Secondary | ICD-10-CM

## 2020-09-20 DIAGNOSIS — F53 Postpartum depression: Secondary | ICD-10-CM

## 2020-09-20 DIAGNOSIS — Z79899 Other long term (current) drug therapy: Secondary | ICD-10-CM

## 2020-09-20 DIAGNOSIS — R87610 Atypical squamous cells of undetermined significance on cytologic smear of cervix (ASC-US): Secondary | ICD-10-CM | POA: Insufficient documentation

## 2020-09-20 DIAGNOSIS — O99345 Other mental disorders complicating the puerperium: Secondary | ICD-10-CM

## 2020-09-20 DIAGNOSIS — Z8659 Personal history of other mental and behavioral disorders: Secondary | ICD-10-CM | POA: Insufficient documentation

## 2020-09-20 DIAGNOSIS — Z975 Presence of (intrauterine) contraceptive device: Secondary | ICD-10-CM

## 2020-09-20 MED ORDER — SERTRALINE HCL 50 MG PO TABS: 100.0000 mg | ORAL_TABLET | Freq: Every day | ORAL | 3 refills | Status: DC

## 2020-09-20 MED ORDER — MISOPROSTOL 200 MCG PO TABS: 200.0000 ug | ORAL_TABLET | Freq: Once | ORAL | 0 refills | Status: DC

## 2020-09-20 NOTE — Progress Notes (Signed)
Obstetrics & Gynecology Office Visit   Chief Complaint:  Chief Complaint  Patient presents with   Contraception   Gynecologic Exam    History of Present Visit: Christina Riggs present s for IUD placement, follow up on medication and a repeat pap smear (ASCUS pap last year)  She is several months postpartum , having had a preterm delivery.she missed ehr 6 wk PP visit, and then was seen several weeks ago. She is taking zoloft for mild depression, and reports that the medication is making her feel better. She would like to continue.She denies side effects.   Review of Systems:  Review of Systems  Eyes: Negative.   Respiratory: Negative.    Cardiovascular: Negative.   Gastrointestinal: Negative.   Genitourinary: Negative.   Musculoskeletal: Negative.   All other systems reviewed and are negative.   Past Medical History:  Past Medical History:  Diagnosis Date   Fracture of foot    Right foot fracture 10/2019   Hypertension    per pt report    Past Surgical History:  Past Surgical History:  Procedure Laterality Date   NO PAST SURGERIES      Gynecologic History: Patient's last menstrual period was 08/30/2020.  Obstetric History: G3P2103  Family History:  Family History  Problem Relation Age of Onset   Hypertension Father    Stroke Father     Social History:  Social History   Socioeconomic History   Marital status: Single    Spouse name: Not on file   Number of children: Not on file   Years of education: Not on file   Highest education level: Not on file  Occupational History   Not on file  Tobacco Use   Smoking status: Former    Types: Cigars   Smokeless tobacco: Never   Tobacco comments:    1  day  Vaping Use   Vaping Use: Never used  Substance and Sexual Activity   Alcohol use: Not Currently    Comment: last use 07/2019   Drug use: No   Sexual activity: Yes    Comment: no bcm since IUD removed 08/2019  Other Topics Concern   Not on file  Social History  Narrative   Not on file   Social Determinants of Health   Financial Resource Strain: Not on file  Food Insecurity: Not on file  Transportation Needs: Not on file  Physical Activity: Not on file  Stress: Not on file  Social Connections: Not on file  Intimate Partner Violence: Unknown   Fear of Current or Ex-Partner: Not on file   Emotionally Abused: No   Physically Abused: No   Sexually Abused: No    Allergies:  No Known Allergies  Medications: Prior to Admission medications   Medication Sig Start Date End Date Taking? Authorizing Provider  Prenatal Vit-Fe Fumarate-FA (PRENATAL MULTIVITAMIN) TABS tablet Take 1 tablet by mouth daily at 12 noon.   Yes [provider]  sertraline (ZOLOFT) 50 MG tablet TAKE 1 TABLET (50 MG TOTAL) BY MOUTH DAILY. TAKE HALF A TABLET FOR THE FIRST 5-7 DAYS. 09/07/20  Yes [name], CNM  acetaminophen (TYLENOL) 325 MG tablet Take 650 mg by mouth every 6 (six) hours as needed. Patient not taking: Reported on 09/20/2020    [provider]  misoprostol (CYTOTEC) 200 MCG tablet Place 1 tablet (200 mcg total) vaginally once for 1 dose. At bedtime evening prior to procedure 09/20/20 09/20/20  [name], CNM    Physical  Exam Vitals:  Vitals:   09/20/20 1327  BP: 130/80   Patient's last menstrual period was 08/30/2020.  Physical Exam Constitutional:      Appearance: Normal appearance. She is obese.     Comments: High BMI of >40  HENT:     Head: Normocephalic and atraumatic.     Nose: Nose normal.  Cardiovascular:     Rate and Rhythm: Normal rate and regular rhythm.  Pulmonary:     Effort: Pulmonary effort is normal.     Breath sounds: Normal breath sounds.  Abdominal:     Comments: Adipose  Genitourinary:    General: Normal vulva.  Musculoskeletal:     Cervical back: Normal range of motion and neck supple.     Comments: Uterus is anteverted. "Deep" pelvis, and difficulty accessing cervix for IUD due to high BMI and  depth of pelvis.  No rashes or external lesions. Normal vaginal mucosa, scant white , non malodorous discharge.  Skin:    General: Skin is warm and dry.  Neurological:     General: No focal deficit present.     Mental Status: She is alert and oriented to person, place, and time.     Comments: Einburgh score is 12 today, down from 19 at last visit.   Psychiatric:        Behavior: Behavior normal.        Thought Content: Thought content normal.        Judgment: Judgment normal.   Focus on the pelvic portion  Attempt to place IUD made using largest speculum, including spec using a condom cover. Unable to access the cervical os despite repeated attempts.   Assessment: 30 y.o. [id number]  for IUD placement, medication f/u and repeat pap smear. Unable to place IUD today   Plan: Problem List Items Addressed This Visit   None Visit Diagnoses     Cervical cancer screening    -  Primary   Relevant Orders   Cytology - PAP   Encounter for IUD insertion       Medication management       Atypical squamous cell changes of undetermined significance (ASCUS) on cervical cytology with negative high risk human papilloma virus (HPV) test result       Relevant Orders   Cytology - PAP   Contraception, device intrauterine       Relevant Medications   misoprostol (CYTOTEC) 200 MCG tablet     Will reschedule her for IUD placement with Dr. Tiburcio Pea. Cytotec - rx sent for her to take the night before.  Pap smear retrieved. We discussed her medication and will increase the dosage for her. She will f/u  in several month sregarding mood. Instructed her to call the office for any side effects or problems with the dosage.  [name], CNM  09/20/2020 2:09 PM

## 2020-09-27 ENCOUNTER — Encounter: Payer: Self-pay | Admitting: Obstetrics

## 2020-09-27 DIAGNOSIS — R87610 Atypical squamous cells of undetermined significance on cytologic smear of cervix (ASC-US): Secondary | ICD-10-CM | POA: Insufficient documentation

## 2020-09-27 LAB — CYTOLOGY - PAP
Comment: NEGATIVE
Diagnosis: UNDETERMINED — AB
High risk HPV: POSITIVE — AB

## 2020-10-12 ENCOUNTER — Ambulatory Visit: Payer: Medicaid Other | Admitting: Obstetrics & Gynecology

## 2021-03-25 ENCOUNTER — Other Ambulatory Visit: Payer: Self-pay

## 2021-03-25 ENCOUNTER — Emergency Department
Admission: EM | Admit: 2021-03-25 | Discharge: 2021-03-25 | Disposition: A | Discharge status: Home / Self Care | Payer: Medicaid Other | Attending: Emergency Medicine | Admitting: Emergency Medicine

## 2021-03-25 ENCOUNTER — Encounter: Payer: Self-pay | Admitting: Emergency Medicine

## 2021-03-25 DIAGNOSIS — J020 Streptococcal pharyngitis: Secondary | ICD-10-CM | POA: Diagnosis not present

## 2021-03-25 DIAGNOSIS — R519 Headache, unspecified: Secondary | ICD-10-CM | POA: Diagnosis present

## 2021-03-25 DIAGNOSIS — Z20822 Contact with and (suspected) exposure to covid-19: Secondary | ICD-10-CM | POA: Insufficient documentation

## 2021-03-25 LAB — GROUP A STREP BY PCR: Group A Strep by PCR: DETECTED — AB

## 2021-03-25 LAB — RESP PANEL BY RT-PCR (FLU A&B, COVID) ARPGX2
Influenza A by PCR: NEGATIVE
Influenza B by PCR: NEGATIVE
SARS Coronavirus 2 by RT PCR: NEGATIVE

## 2021-03-25 MED ORDER — AMOXICILLIN 500 MG PO CAPS
1000.0000 mg | ORAL_CAPSULE | Freq: Once | ORAL | Status: AC
  Administered 2021-03-25: 1000 mg via ORAL
  Filled 2021-03-25: qty 2

## 2021-03-25 MED ORDER — AMOXICILLIN 875 MG PO TABS: 875.0000 mg | ORAL_TABLET | Freq: Two times a day (BID) | ORAL | 0 refills | Status: DC

## 2021-03-25 MED ORDER — ACETAMINOPHEN 325 MG PO TABS
650.0000 mg | ORAL_TABLET | Freq: Once | ORAL | Status: AC
  Administered 2021-03-25: 650 mg via ORAL
  Filled 2021-03-25: qty 2

## 2021-03-25 MED ORDER — MAGIC MOUTHWASH W/LIDOCAINE: 5.0000 mL | Freq: Four times a day (QID) | ORAL | 0 refills | Status: DC

## 2021-03-25 NOTE — ED Provider Notes (Signed)
? ?[facility] ?Provider Note ? ?Patient Contact: 10:08 PM (approximate) ? ? ?History  ? ?Sore Throat, Generalized Body Aches, and Nasal Congestion ? ? ?HPI ? ?Christina Riggs is a 31 y.o. female who presents the emergency department sore throat, headache, body aches.  Patient states that symptoms have been ongoing x2 days.  Fevers in addition to additional symptoms.  No recent sick contacts.  Patient states that occasionally she will be swallowing and have a slight gag reflex making her want to vomit but she does not actually have emesis.  No other complaints at this time.  No medications prior to arrival. ?  ? ? ?Physical Exam  ? ?Triage Vital Signs: ?ED Triage Vitals  ?Enc Vitals Group  ?   BP 03/25/21 1933 (!) 146/104  ?   Pulse Rate 03/25/21 1933 99  ?   Resp 03/25/21 1933 20  ?   Temp 03/25/21 1933 (!) 100.4 ?F (38 ?C)  ?   Temp src --   ?   SpO2 03/25/21 1933 100 %  ?   Weight 03/25/21 1935 255 lb (115.7 kg)  ?   Height 03/25/21 1935 5\' 4"  (1.626 m)  ?   Head Circumference --   ?   Peak Flow --   ?   Pain Score 03/25/21 1935 10  ?   Pain Loc --   ?   Pain Edu? --   ?   Excl. in [place]? --   ? ? ?Most recent vital signs: ?Vitals:  ? 03/25/21 1933  ?BP: (!) 146/104  ?Pulse: 99  ?Resp: 20  ?Temp: (!) 100.4 ?F (38 ?C)  ?SpO2: 100%  ? ? ? ?General: Alert and in no acute distress. ?ENT: ?     Ears:  ?     Nose: No congestion/rhinnorhea. ?     Mouth/Throat: Mucous membranes are moist.  Tonsils are surgically absent but patient does have exudates.  She has a lingular tonsils that are edematous as well.  Uvula is midline ?Neck: No stridor. No cervical spine tenderness to palpation.  No edema, erythema of the anterior neck. ?Hematological/Lymphatic/Immunilogical: Scattered anterior cervical lymphadenopathy. ?Cardiovascular:  Good peripheral perfusion ?Respiratory: Normal respiratory effort without tachypnea or retractions. Lungs CTAB. Good air entry to the bases with no decreased or absent breath  sounds. ?Gastrointestinal: Bowel sounds ?4 quadrants. Soft and nontender to palpation. No guarding or rigidity. No palpable masses. No distention.  ?Musculoskeletal: Full range of motion to all extremities.  ?Neurologic:  No gross focal neurologic deficits are appreciated.  ?Skin:   No rash noted ?Other: ? ? ?ED Results / Procedures / Treatments  ? ?Labs ?(all labs ordered are listed, but only abnormal results are displayed) ?Labs Reviewed  ?GROUP A STREP BY PCR - Abnormal; Notable for the following components:  ?    Result Value  ? Group A Strep by PCR DETECTED (*)   ? All other components within normal limits  ?RESP PANEL BY RT-PCR (FLU A&B, COVID) [id number]  ? ? ? ?EKG ? ? ? ? ?RADIOLOGY ? ? ? ?No results found. ? ?PROCEDURES: ? ?Critical Care performed: No ? ?Procedures ? ? ?MEDICATIONS ORDERED IN ED: ?Medications  ?amoxicillin (AMOXIL) capsule 1,000 mg (has no administration in time range)  ?acetaminophen (TYLENOL) tablet 650 mg (650 mg Oral Given 03/25/21 1941)  ? ? ? ?IMPRESSION / MDM / ASSESSMENT AND PLAN / ED COURSE  ?I reviewed the triage vital signs and the nursing notes. ?             ?               ? ?  Differential diagnosis includes, but is not limited to, COVID, flu, strep, peritonsillar abscess, retropharyngeal abscess ? ? ?Patient's diagnosis is consistent with strep pharyngitis.  Patient presented to the emergency department complaining of sore throat, headache, fever.  Patient has positive strep test which is consistent with physical exam.  No evidence of peritonsillar or retropharyngeal abscess on physical exam.  Negative COVID and flu.  Patient will be treated with antibiotics and Magic mouthwash.  Follow-up with primary care as needed.  Return precautions discussed with the patient..  Patient is given ED precautions to return to the ED for any worsening or new symptoms. ? ? ? ?  ? ? ?FINAL CLINICAL IMPRESSION(S) / ED DIAGNOSES  ? ?Final diagnoses:  ?Strep pharyngitis  ? ? ? ?Rx / DC Orders  ? ?ED  Discharge Orders   ? ?      Ordered  ?  amoxicillin (AMOXIL) 875 MG tablet  2 times daily       ? 03/25/21 2218  ?  magic mouthwash w/lidocaine SOLN  4 times daily       ?Note to Pharmacy: Dispense in a 1/1/1 ratio. Use lidocaine, diphenhydramine, prednisolone  ? 03/25/21 2218  ? ?  ?  ? ?  ? ? ? ?Note:  This document was prepared using Dragon voice recognition software and may include unintentional dictation errors. ?  ?Darletta Moll, [name] ?03/25/21 2218 ? ?  ?Lucrezia Starch, MD ?03/25/21 2244 ? ?

## 2021-03-25 NOTE — ED Triage Notes (Signed)
Pt to ED via POV with c/o Bodyaches, fever and nasal congestion. She can feel the mucous draining into her throat and it makes her feel like she is going to throw up. Pt has taken thera flu today ?

## 2021-03-26 ENCOUNTER — Encounter: Payer: Self-pay | Admitting: Obstetrics

## 2021-06-20 IMAGING — DX DG ANKLE COMPLETE 3+V*R*
3 series · 3 of 3 positions shown · non-contrast
Comparison: None.

CLINICAL DATA: Pain

EXAM:
RIGHT ANKLE - COMPLETE 3+ VIEW

[ankle ap]
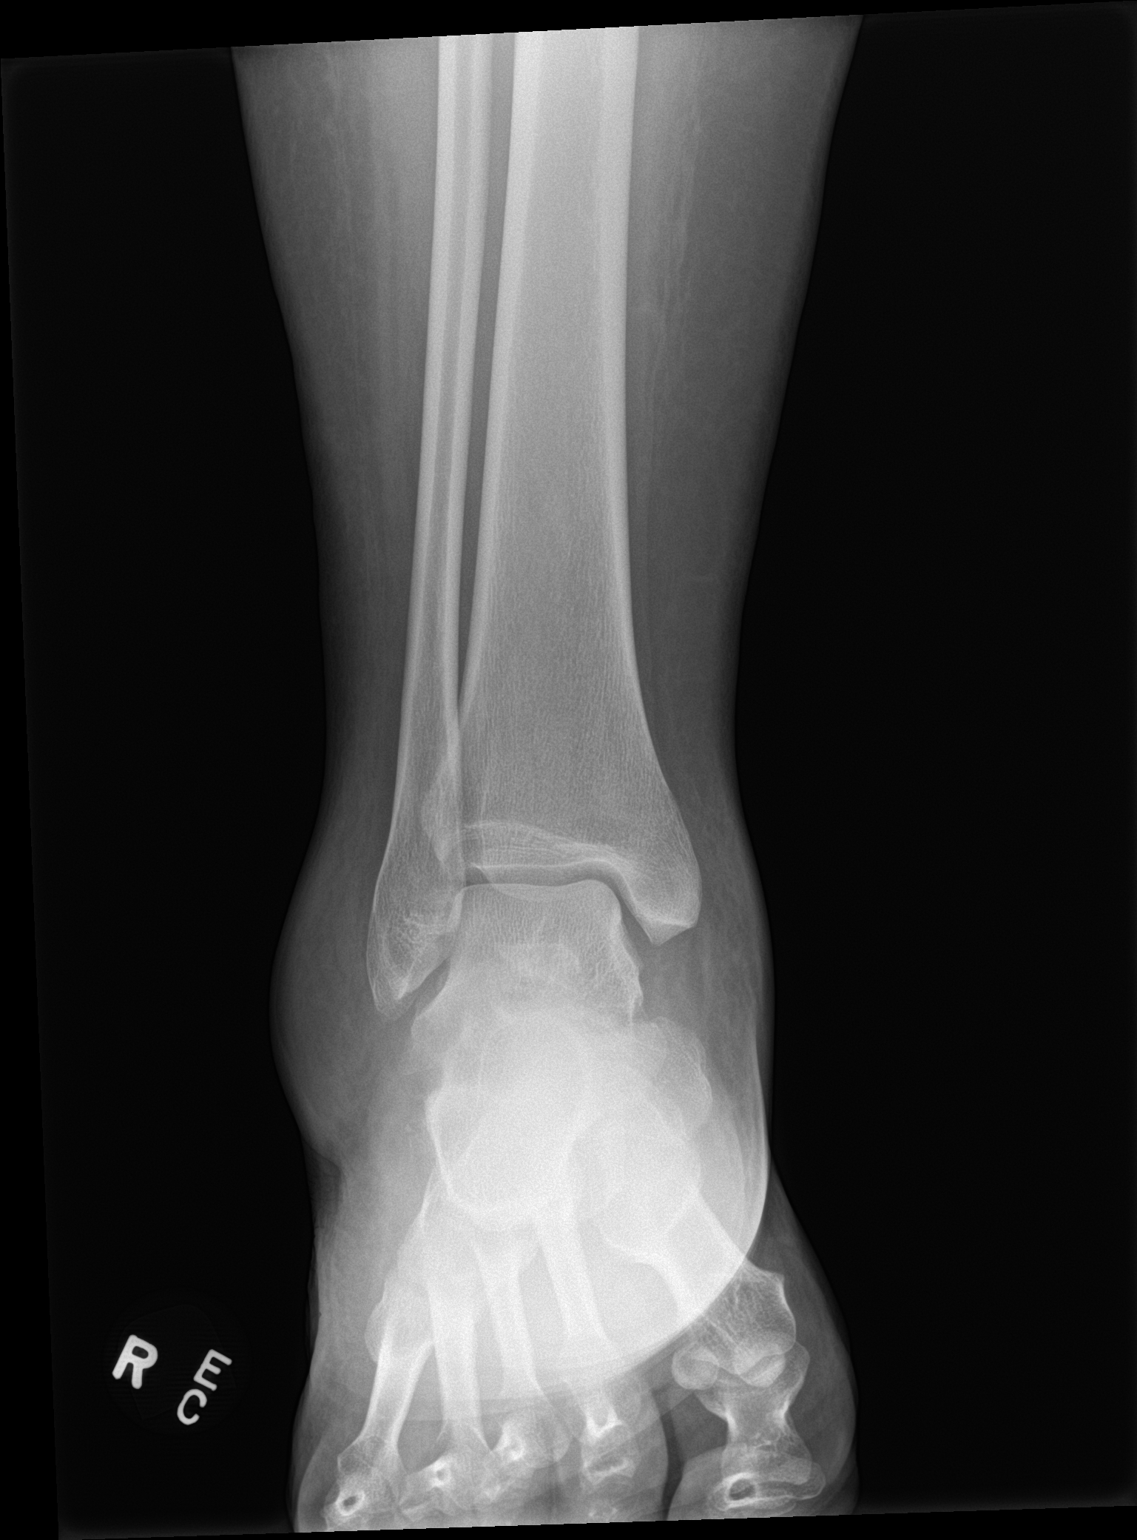

[ankle obl]
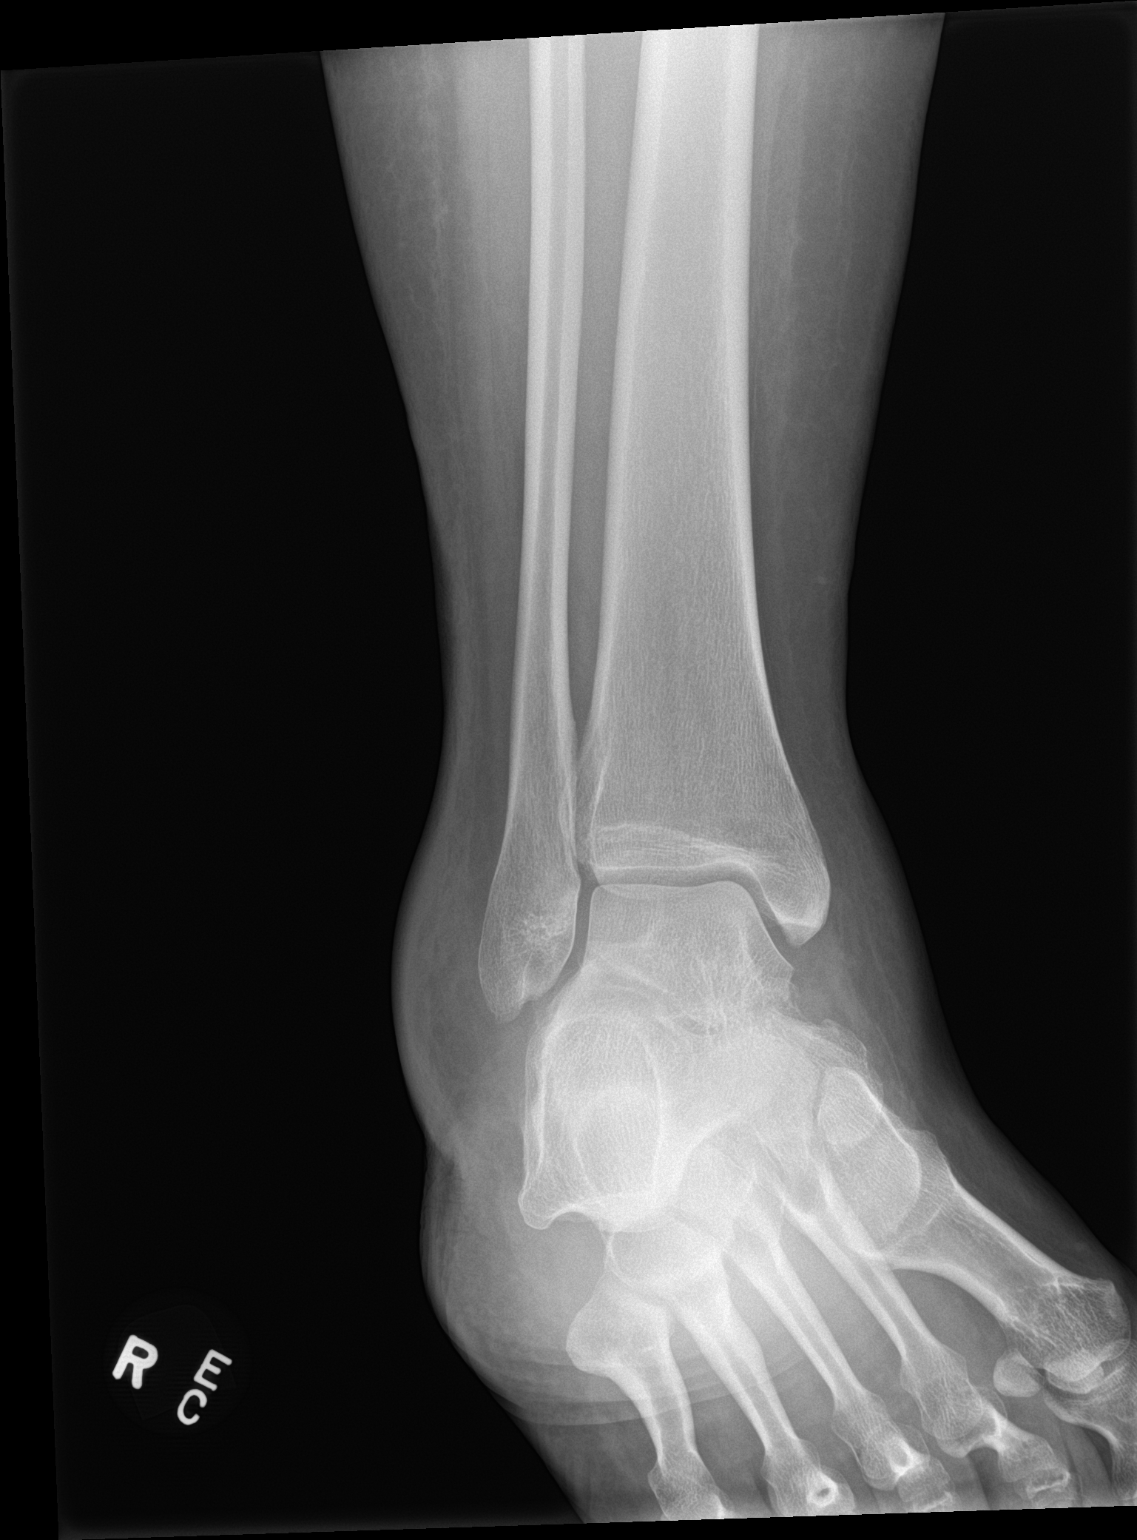

[ankle lat]
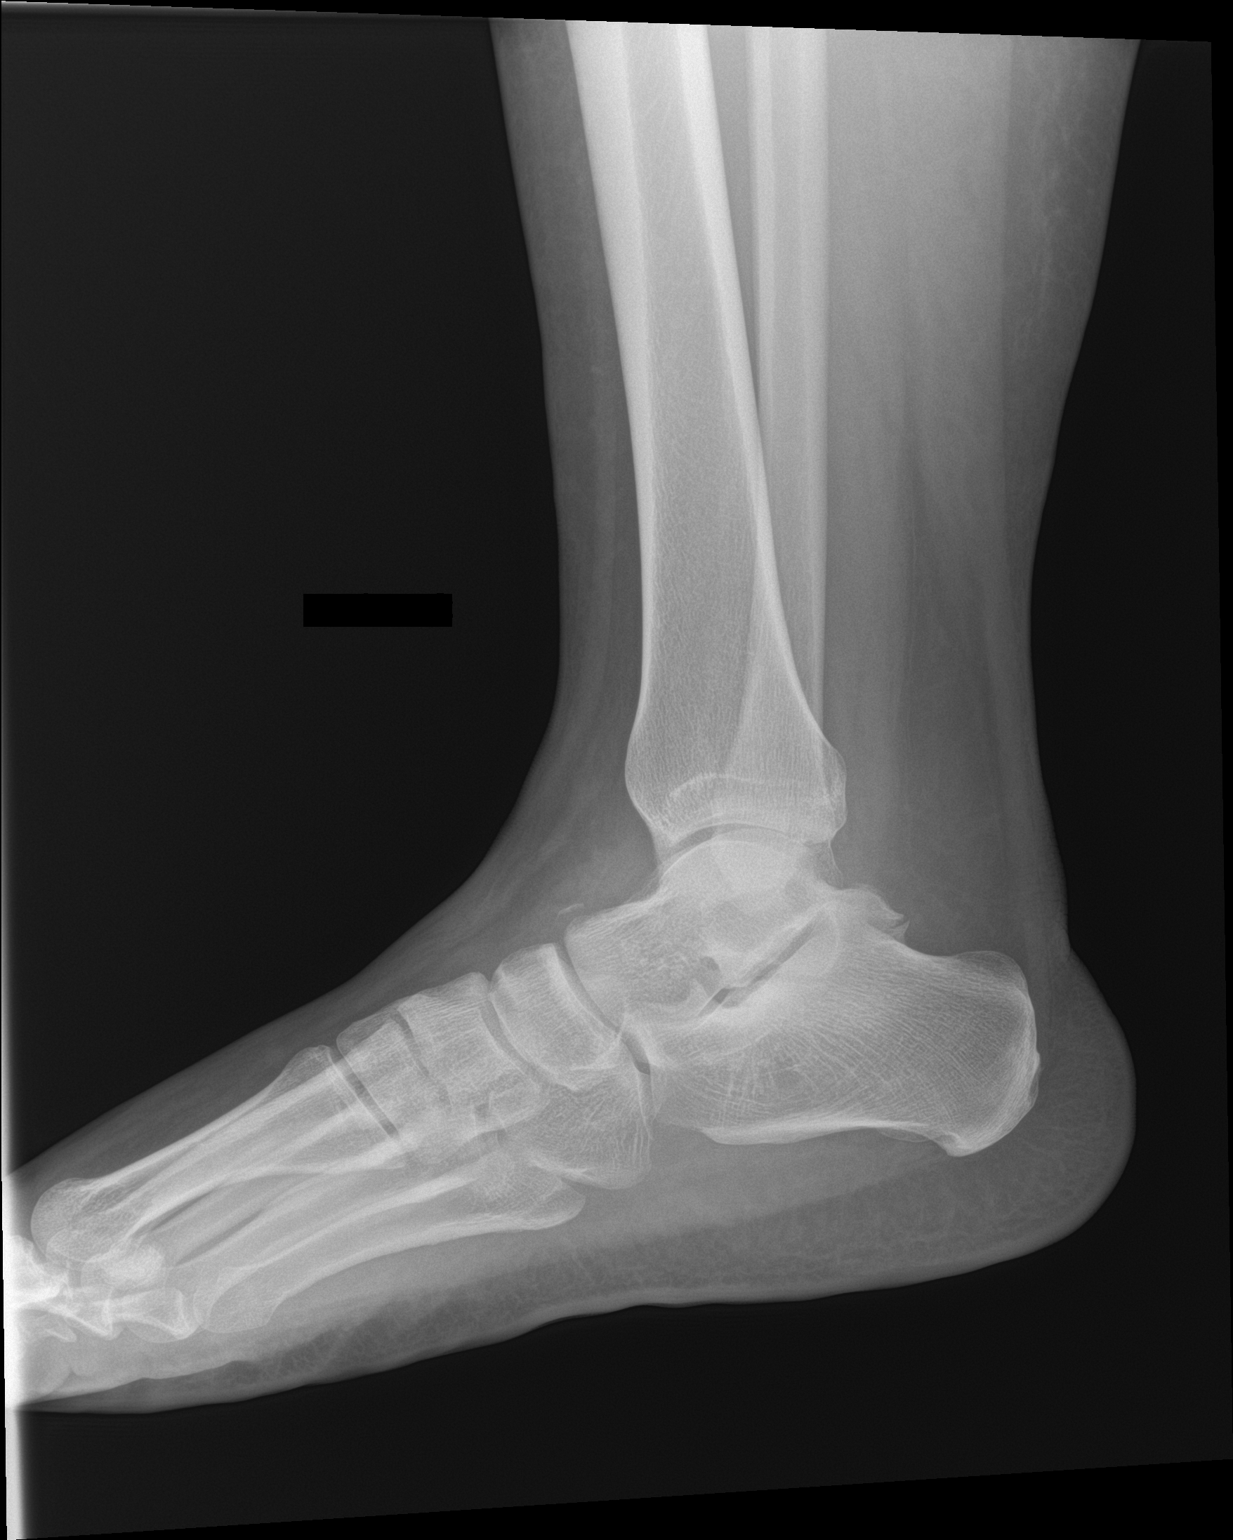

[3 of 3 positions shown; findings below may reference images not displayed]

FINDINGS: There is extensive soft tissue swelling primarily about the lateral
malleolus. Findings are suspicious for a subtle avulsion fracture
arising from the lateral calcaneus. There is an additional avulsion
fracture likely arising from the dorsal anterior talus. There is a
small joint effusion.
IMPRESSION: 1. Probable subtle avulsion fracture arising from the lateral
calcaneus.
2. Avulsion fracture arising from the dorsal anterior talus.
3. Extensive soft tissue swelling about the ankle.

## 2021-08-11 ENCOUNTER — Emergency Department: Payer: Medicaid Other

## 2021-08-11 ENCOUNTER — Other Ambulatory Visit: Payer: Self-pay

## 2021-08-11 ENCOUNTER — Emergency Department
Admission: EM | Admit: 2021-08-11 | Discharge: 2021-08-12 | Disposition: A | Discharge status: Home / Self Care | Payer: Medicaid Other | Attending: Emergency Medicine | Admitting: Emergency Medicine

## 2021-08-11 DIAGNOSIS — I10 Essential (primary) hypertension: Secondary | ICD-10-CM | POA: Insufficient documentation

## 2021-08-11 DIAGNOSIS — G43001 Migraine without aura, not intractable, with status migrainosus: Secondary | ICD-10-CM | POA: Insufficient documentation

## 2021-08-11 DIAGNOSIS — R0781 Pleurodynia: Secondary | ICD-10-CM | POA: Insufficient documentation

## 2021-08-11 DIAGNOSIS — R079 Chest pain, unspecified: Secondary | ICD-10-CM

## 2021-08-11 LAB — BASIC METABOLIC PANEL
Anion gap: 7 (ref 5–15)
BUN: 11 mg/dL (ref 6–20)
CO2: 25 mmol/L (ref 22–32)
Calcium: 8.9 mg/dL (ref 8.9–10.3)
Chloride: 105 mmol/L (ref 98–111)
Creatinine, Ser: 0.79 mg/dL (ref 0.44–1.00)
GFR, Estimated: >60 mL/min (ref >60)
Glucose, Bld: 101 mg/dL — ABNORMAL HIGH (ref 70–99)
Potassium: 4 mmol/L (ref 3.5–5.1)
Sodium: 137 mmol/L (ref 135–145)

## 2021-08-11 LAB — CBC
HCT: 41.9 % (ref 36.0–46.0)
Hemoglobin: 13.5 g/dL (ref 12.0–15.0)
MCH: 29.8 pg (ref 26.0–34.0)
MCHC: 32.2 g/dL (ref 30.0–36.0)
MCV: 92.5 fL (ref 80.0–100.0)
Platelets: 377 10*3/uL (ref 150–400)
RBC: 4.53 MIL/uL (ref 3.87–5.11)
RDW: 13.1 % (ref 11.5–15.5)
WBC: 5.9 10*3/uL (ref 4.0–10.5)
nRBC: 0 % (ref 0.0–0.2)

## 2021-08-11 LAB — POC URINE PREG, ED: Preg Test, Ur: NEGATIVE

## 2021-08-11 LAB — TROPONIN I (HIGH SENSITIVITY): Troponin I (High Sensitivity): 3 ng/L (ref <18)

## 2021-08-11 MED ORDER — METOCLOPRAMIDE HCL 5 MG/ML IJ SOLN
10.0000 mg | Freq: Once | INTRAMUSCULAR | Status: AC
  Administered 2021-08-11: 10 mg via INTRAVENOUS
  Filled 2021-08-11: qty 2

## 2021-08-11 MED ORDER — DIPHENHYDRAMINE HCL 50 MG/ML IJ SOLN
50.0000 mg | Freq: Once | INTRAMUSCULAR | Status: AC
  Administered 2021-08-11: 50 mg via INTRAVENOUS
  Filled 2021-08-11: qty 1

## 2021-08-11 MED ORDER — SODIUM CHLORIDE 0.9 % IV BOLUS
1000.0000 mL | Freq: Once | INTRAVENOUS | Status: AC
  Administered 2021-08-11: 1000 mL via INTRAVENOUS

## 2021-08-11 NOTE — ED Triage Notes (Signed)
Ambulatory to triage with c/o chest pain and migraine x 2 days with nausea. Hx of migraine, reports sx similar in nature to previous episodes.

## 2021-08-11 NOTE — ED Provider Notes (Addendum)
Medical screening examination/treatment/procedure(s) were conducted as a shared visit with non-physician practitioner(s) and myself.  I personally evaluated the patient during the encounter.     ----------------------------------------- 1:42 AM on 08/12/2021 ----------------------------------------- Headache improved.  CT head interpreted by me, negative for mass or intracranial hemorrhage.  Radiology report reviewed.  Stable for discharge.     [facility] Provider Note  Patient Contact: 11:18 PM (approximate)   History   Chest Pain   HPI  Christina Riggs is a 31 y.o. female presents to the emergency department with multiple medical complaints.  Patient is primarily concern for severe migraine.  Patient reports that headache came on slowly and progressed in intensity gradually.  She has had no associated blurry vision or dizziness.  She denies weakness in the upper and lower extremities.  She states that her current headache feels similar to prior migraines but she has never had to seek care in the emergency department for headaches in the past.  She is also complaining of some left-sided pleuritic chest pain.  She reports that she is under more stress than usual.  No associated chest tightness or shortness of breath.  No fever or chills.  No sick contacts in the home with similar symptoms.      Physical Exam   Triage Vital Signs: ED Triage Vitals [08/11/21 2044]  Enc Vitals Group     BP (!) 156/86     Pulse Rate 98     Resp 18     Temp 97.9 F (36.6 C)     Temp Source Oral     SpO2 100 %     Weight 225 lb (102.1 kg)     Height 5\' 4"  (1.626 m)     Head Circumference      Peak Flow      Pain Score 10     Pain Loc      Pain Edu?      Excl. in [place]?     Most recent vital signs: Vitals:   08/11/21 2044  BP: (!) 156/86  Pulse: 98  Resp: 18  Temp: 97.9 F (36.6 C)  SpO2: 100%     General: Alert and in no acute distress. Eyes:   PERRL. EOMI. Head: No acute traumatic findings ENT:      Nose: No congestion/rhinnorhea.      Mouth/Throat: Mucous membranes are moist.  Neck: No stridor. No cervical spine tenderness to palpation. Cardiovascular:  Good peripheral perfusion Respiratory: Normal respiratory effort without tachypnea or retractions. Lungs CTAB. Good air entry to the bases with no decreased or absent breath sounds. Gastrointestinal: Bowel sounds 4 quadrants. Soft and nontender to palpation. No guarding or rigidity. No palpable masses. No distention. No CVA tenderness. Musculoskeletal: Full range of motion to all extremities.  Neurologic:  No gross focal neurologic deficits are appreciated.  Skin:   No rash noted Other:   ED Results / Procedures / Treatments   Labs (all labs ordered are listed, but only abnormal results are displayed) Labs Reviewed  BASIC METABOLIC PANEL - Abnormal; Notable for the following components:      Result Value   Glucose, Bld 101 (*)    All other components within normal limits  CBC  POC URINE PREG, ED  TROPONIN I (HIGH SENSITIVITY)  TROPONIN I (HIGH SENSITIVITY)     EKG  Normal sinus rhythm without ST segment elevation or other apparent arrhythmia.   RADIOLOGY  I personally viewed and evaluated these images as  part of my medical decision making, as well as reviewing the written report by the radiologist.  ED Provider Interpretation: No acute abnormality on chest x-ray.   PROCEDURES:  Critical Care performed: No  Procedures   MEDICATIONS ORDERED IN ED: Medications  metoCLOPramide (REGLAN) injection 10 mg (10 mg Intravenous Given 08/11/21 2312)  diphenhydrAMINE (BENADRYL) injection 50 mg (50 mg Intravenous Given 08/11/21 2312)  sodium chloride 0.9 % bolus 1,000 mL (1,000 mLs Intravenous Bolus 08/11/21 2312)     IMPRESSION / MDM / ASSESSMENT AND PLAN / ED COURSE  I reviewed the triage vital signs and the nursing notes.                               Assessment and plan Headache Chest pain 31 year old female presents to the emergency department with left-sided chest pain that is pleuritic in nature that started today as well as an associated migraine.  Patient was hypertensive at triage but vital signs were otherwise reassuring.  She was alert and nontoxic-appearing.  CBC, BMP and troponin within range.  EKG indicated normal sinus rhythm without ST segment elevation or other apparent arrhythmia.  No acute abnormalities on chest x-ray.  Urine pregnancy negative.  Because this is the first time patient is seeking care for migraine, will obtain CT head and administer migraine cocktail consisting of Benadryl and Reglan.  Will consider administering IV Toradol if CT head reassuring.    Patient has a PERC score of 0 I have low suspicion for PE.  Will obtain second troponin and if within range and patient's headache improved, anticipate discharge.  Patient care transitioned to attending Dr. Scotty Court at shift change.      FINAL CLINICAL IMPRESSION(S) / ED DIAGNOSES   Final diagnoses:  Nonspecific chest pain  Migraine without aura and with status migrainosus, not intractable     Rx / DC Orders   ED Discharge Orders     None        Note:  This document was prepared using Dragon voice recognition software and may include unintentional dictation errors.   Pia Mau [place] [place], Cordelia Poche 08/11/21 2326    Dionne Bucy, MD 08/11/21 2354    Sharman Cheek, MD 08/12/21 [phone]

## 2021-08-11 NOTE — ED Provider Triage Note (Signed)
Emergency Medicine Provider Triage Evaluation Note  Christina Riggs , a 31 y.o. female  was evaluated in triage.  Pt complains of migraine headache x3 days, chest pain x2 days.  Has a history of migraines, states this feels like her normal migraine headache.  She is having some mild nausea and photophobia.  She has been afebrile.  No neck pain.  She also describes 2 days of left-sided chest pain, sharp, no shortness of breath.  Review of Systems  Positive: Chest pain, migraine headache, nausea Negative: Neck pain, fevers, abdominal pain  Physical Exam  BP (!) 156/86 (BP Location: Right Arm)   Pulse 98   Temp 97.9 F (36.6 C) (Oral)   Resp 18   Ht 5\' 4"  (1.626 m)   Wt 102.1 kg   SpO2 100%   BMI 38.62 kg/m  Gen:   Awake, no distress   Resp:  Normal effort  MSK:   Moves extremities without difficulty  Other:    Medical Decision Making  Medically screening exam initiated at 8:46 PM.  Appropriate orders placed.  Christina Riggs was informed that the remainder of the evaluation will be completed by another provider, this initial triage assessment does not replace that evaluation, and the importance of remaining in the ED until their evaluation is complete.     Christina Riggs, Evon Slack 08/11/21 2048

## 2021-08-12 LAB — TROPONIN I (HIGH SENSITIVITY): Troponin I (High Sensitivity): 3 ng/L (ref <18)

## 2021-08-12 NOTE — ED Notes (Signed)
Patient verbalizes understanding of discharge instructions. Opportunity for questioning and answers were provided. Armband removed by staff, pt discharged from ED to home 

## 2021-09-26 ENCOUNTER — Emergency Department
Admission: EM | Admit: 2021-09-26 | Discharge: 2021-09-26 | Disposition: A | Discharge status: Home / Self Care | Payer: Medicaid Other | Attending: Emergency Medicine | Admitting: Emergency Medicine

## 2021-09-26 ENCOUNTER — Encounter: Payer: Self-pay | Admitting: Emergency Medicine

## 2021-09-26 ENCOUNTER — Other Ambulatory Visit: Payer: Self-pay

## 2021-09-26 DIAGNOSIS — Z5321 Procedure and treatment not carried out due to patient leaving prior to being seen by health care provider: Secondary | ICD-10-CM | POA: Insufficient documentation

## 2021-09-26 DIAGNOSIS — K0889 Other specified disorders of teeth and supporting structures: Secondary | ICD-10-CM | POA: Insufficient documentation

## 2021-09-26 NOTE — ED Triage Notes (Signed)
Patient ambulatory to triage with steady gait, without difficulty or distress noted; pt reports rt sided upper & lower dental pain since yesterday

## 2022-06-20 ENCOUNTER — Ambulatory Visit (HOSPITAL_COMMUNITY)
Admission: EM | Admit: 2022-06-20 | Discharge: 2022-06-20 | Disposition: A | Discharge status: Home / Self Care | Payer: 59 | Attending: Emergency Medicine | Admitting: Emergency Medicine

## 2022-06-20 ENCOUNTER — Encounter (HOSPITAL_COMMUNITY): Payer: Self-pay | Admitting: *Deleted

## 2022-06-20 DIAGNOSIS — J029 Acute pharyngitis, unspecified: Secondary | ICD-10-CM

## 2022-06-20 DIAGNOSIS — Z3202 Encounter for pregnancy test, result negative: Secondary | ICD-10-CM

## 2022-06-20 LAB — POCT URINE PREGNANCY: Preg Test, Ur: NEGATIVE

## 2022-06-20 NOTE — Discharge Instructions (Signed)
Pregnancy test is negative today  Continue ibuprofen or tylenol for any aches/pains  You can return if needed

## 2022-06-20 NOTE — ED Provider Notes (Signed)
[facility]   CSN: [id number] Arrival date & time: 06/20/22  1116     History   Chief Complaint Chief Complaint  Patient presents with   Sore Throat   Headache   Possible Pregnancy    HPI Christina Riggs is a 32 y.o. female.  Here for pregnancy test. LMP was sometime last month. She has irregular cycles and feels it should be starting soon. Wanted to confirm no preg. No abd pain, NV, spotting/bleeding, discharge  Also had a sore throat a few days ago. Woke up with it but went away same day. Had the [facility] on. No current sore throat, headache, congestion, cough, etc No sick contacts  Past Medical History:  Diagnosis Date   Fracture of foot    Right foot fracture 10/2019   Hypertension    per pt report    Patient Active Problem List   Diagnosis Date Noted   ASCUS of cervix with negative high risk HPV 09/27/2020   History of depressive symptoms 09/20/2020   Postpartum care following vaginal delivery 05/05/2020    Past Surgical History:  Procedure Laterality Date   NO PAST SURGERIES      OB History     Gravida  3   Para  3   Term  2   Preterm  1   AB  0   Living  1      SAB  0   IAB  0   Ectopic  0   Multiple      Live Births  1            Home Medications    Prior to Admission medications   Medication Sig Start Date End Date Taking? Authorizing Provider  acetaminophen (TYLENOL) 325 MG tablet Take 650 mg by mouth every 6 (six) hours as needed. Patient not taking: Reported on 09/20/2020    [provider]  amoxicillin (AMOXIL) 875 MG tablet Take 1 tablet (875 mg total) by mouth 2 (two) times daily. 03/25/21   Cuthriell, Delorise Royals, [name]  magic mouthwash w/lidocaine SOLN Take 5 mLs by mouth 4 (four) times daily. 03/25/21   Cuthriell, Delorise Royals, [name]  misoprostol (CYTOTEC) 200 MCG tablet Place 1 tablet (200 mcg total) vaginally once for 1 dose. At bedtime evening prior to procedure 09/20/20 09/20/20  [name], CNM   Prenatal Vit-Fe Fumarate-FA (PRENATAL MULTIVITAMIN) TABS tablet Take 1 tablet by mouth daily at 12 noon.    [provider]  sertraline (ZOLOFT) 50 MG tablet Take 2 tablets (100 mg total) by mouth daily. Take half a tablet for the first 5-7 days. 09/20/20   [name], CNM    Family History Family History  Problem Relation Age of Onset   Hypertension Father    Stroke Father     Social History Social History   Tobacco Use   Smoking status: Former    Types: Cigars   Smokeless tobacco: Never   Tobacco comments:    1  day  Vaping Use   Vaping Use: Every day  Substance Use Topics   Alcohol use: Yes   Drug use: No     Allergies   Patient has no known allergies.   Review of Systems Review of Systems As per HPI  Physical Exam Triage Vital Signs ED Triage Vitals  Enc Vitals Group     BP 06/20/22 1239 125/89     Pulse Rate 06/20/22 1239 76     Resp 06/20/22  1239 18     Temp 06/20/22 1239 98.7 F (37.1 C)     Temp Source 06/20/22 1239 Oral     SpO2 06/20/22 1239 95 %     Weight --      Height --      Head Circumference --      Peak Flow --      Pain Score 06/20/22 1236 8     Pain Loc --      Pain Edu? --      Excl. in GC? --    No data found.  Updated Vital Signs BP 125/89 (BP Location: Left Arm)   Pulse 76   Temp 98.7 F (37.1 C) (Oral)   Resp 18   LMP  (LMP Unknown) Comment: unsure of date but 04/2022....06/20/22  SpO2 95%    Physical Exam Vitals and nursing note reviewed.  Constitutional:      General: She is not in acute distress.    Appearance: She is not ill-appearing.  HENT:     Mouth/Throat:     Mouth: Mucous membranes are moist.     Pharynx: Oropharynx is clear. No posterior oropharyngeal erythema.     Tonsils: No tonsillar exudate. 0 on the right. 0 on the left.  Eyes:     Conjunctiva/sclera: Conjunctivae normal.  Cardiovascular:     Rate and Rhythm: Normal rate and regular rhythm.     Heart sounds: Normal heart sounds.   Pulmonary:     Effort: Pulmonary effort is normal.     Breath sounds: Normal breath sounds.  Abdominal:     Tenderness: There is no abdominal tenderness.  Musculoskeletal:        General: Normal range of motion.     Cervical back: Normal range of motion.  Skin:    General: Skin is warm and dry.  Neurological:     Mental Status: She is alert and oriented to person, place, and time.     UC Treatments / Results  Labs (all labs ordered are listed, but only abnormal results are displayed) Labs Reviewed  POCT URINE PREGNANCY    EKG   Radiology No results found.  Procedures Procedures (including critical care time)  Medications Ordered in UC Medications - No data to display  Initial Impression / Assessment and Plan / UC Course  I have reviewed the triage vital signs and the nursing notes.  Pertinent labs & imaging results that were available during my care of the patient were reviewed by me and considered in my medical decision making (see chart for details).  Negative UPT in clinic Not having any symptoms at this time. Discussed can use ibu/tylenol if sore throat returns. No questions at this time.   Final Clinical Impressions(s) / UC Diagnoses   Final diagnoses:  Negative pregnancy test     Discharge Instructions      Pregnancy test is negative today  Continue ibuprofen or tylenol for any aches/pains  You can return if needed    ED Prescriptions   None    PDMP not reviewed this encounter.   Marlow Baars, [place] 06/20/22 1319

## 2022-06-20 NOTE — ED Triage Notes (Signed)
Pt states she has had headache and sore throat x couple days. She has been taking theraflu  She would also like a pregnancy test she doesn't know LMP but states last month.

## 2022-07-26 ENCOUNTER — Encounter (HOSPITAL_COMMUNITY): Payer: Self-pay

## 2022-07-26 ENCOUNTER — Ambulatory Visit (HOSPITAL_COMMUNITY)
Admission: EM | Admit: 2022-07-26 | Discharge: 2022-07-26 | Disposition: A | Discharge status: Home / Self Care | Payer: 59 | Attending: Emergency Medicine | Admitting: Emergency Medicine

## 2022-07-26 DIAGNOSIS — S46812A Strain of other muscles, fascia and tendons at shoulder and upper arm level, left arm, initial encounter: Secondary | ICD-10-CM | POA: Diagnosis not present

## 2022-07-26 MED ORDER — KETOROLAC TROMETHAMINE 30 MG/ML IJ SOLN
30.0000 mg | Freq: Once | INTRAMUSCULAR | Status: AC
Start: 2022-07-26 — End: 2022-07-26
  Administered 2022-07-26: 30 mg via INTRAMUSCULAR

## 2022-07-26 MED ORDER — METHOCARBAMOL 500 MG PO TABS: 500.0000 mg | ORAL_TABLET | Freq: Two times a day (BID) | ORAL | 0 refills | Status: DC

## 2022-07-26 MED ORDER — KETOROLAC TROMETHAMINE 30 MG/ML IJ SOLN
INTRAMUSCULAR | Status: AC
  Filled 2022-07-26: qty 1

## 2022-07-26 MED ORDER — IBUPROFEN 600 MG PO TABS: 600.0000 mg | ORAL_TABLET | Freq: Four times a day (QID) | ORAL | 0 refills | Status: DC | PRN

## 2022-07-26 NOTE — ED Provider Notes (Addendum)
[facility]    CSN: [id number] Arrival date & time: 07/26/22  1549      History   Chief Complaint No chief complaint on file.   HPI Christina Riggs is a 32 y.o. female.   Patient presents to urgent care for evaluation of pain to the left neck that radiates to the left axilla, left side, left side of the breast, and left upper back that started upon waking this morning.  She also states that upon waking this morning, her left leg felt "weak and numb" but this resolved quickly.  Denies numbness and tingling to the left neck and the left upper extremity.  No recent trauma/injuries to the left neck/left shoulder/left arm.  No weakness to the upper extremities.  Denies headache, ear pain, fever, chills, viral URI symptoms, chest pain, shortness of breath, heart palpitations, and dizziness.  She took some Tylenol this morning and states it did not help at all with the pain.     Past Medical History:  Diagnosis Date   Fracture of foot    Right foot fracture 10/2019   Hypertension    per pt report    Patient Active Problem List   Diagnosis Date Noted   ASCUS of cervix with negative high risk HPV 09/27/2020   History of depressive symptoms 09/20/2020   Postpartum care following vaginal delivery 05/05/2020    Past Surgical History:  Procedure Laterality Date   NO PAST SURGERIES      OB History     Gravida  3   Para  3   Term  2   Preterm  1   AB  0   Living  1      SAB  0   IAB  0   Ectopic  0   Multiple      Live Births  1            Home Medications    Prior to Admission medications   Medication Sig Start Date End Date Taking? Authorizing Provider  ibuprofen (ADVIL) 600 MG tablet Take 1 tablet (600 mg total) by mouth every 6 (six) hours as needed. 07/26/22  Yes Carlisle Beers, FNP  methocarbamol (ROBAXIN) 500 MG tablet Take 1 tablet (500 mg total) by mouth 2 (two) times daily. 07/26/22  Yes Carlisle Beers, FNP   acetaminophen (TYLENOL) 325 MG tablet Take 650 mg by mouth every 6 (six) hours as needed. Patient not taking: Reported on 09/20/2020    [provider]  amoxicillin (AMOXIL) 875 MG tablet Take 1 tablet (875 mg total) by mouth 2 (two) times daily. 03/25/21   Cuthriell, Delorise Royals, [name]  magic mouthwash w/lidocaine SOLN Take 5 mLs by mouth 4 (four) times daily. 03/25/21   Cuthriell, Delorise Royals, [name]  misoprostol (CYTOTEC) 200 MCG tablet Place 1 tablet (200 mcg total) vaginally once for 1 dose. At bedtime evening prior to procedure 09/20/20 09/20/20  [name], CNM  Prenatal Vit-Fe Fumarate-FA (PRENATAL MULTIVITAMIN) TABS tablet Take 1 tablet by mouth daily at 12 noon.    [provider]  sertraline (ZOLOFT) 50 MG tablet Take 2 tablets (100 mg total) by mouth daily. Take half a tablet for the first 5-7 days. 09/20/20   [name], CNM    Family History Family History  Problem Relation Age of Onset   Hypertension Father    Stroke Father     Social History Social History   Tobacco Use   Smoking  status: Former    Types: Cigars   Smokeless tobacco: Never   Tobacco comments:    1  day  Vaping Use   Vaping Use: Every day  Substance Use Topics   Alcohol use: Yes   Drug use: No     Allergies   Patient has no known allergies.   Review of Systems Review of Systems Per HPI  Physical Exam Triage Vital Signs ED Triage Vitals  Enc Vitals Group     BP 07/26/22 1610 (!) 141/99     Pulse Rate 07/26/22 1610 91     Resp 07/26/22 1610 20     Temp 07/26/22 1610 98.3 F (36.8 C)     Temp Source 07/26/22 1610 Oral     SpO2 07/26/22 1610 98 %     Weight --      Height --      Head Circumference --      Peak Flow --      Pain Score 07/26/22 1607 10     Pain Loc --      Pain Edu? --      Excl. in GC? --    No data found.  Updated Vital Signs BP (!) 141/99 (BP Location: Right Arm)   Pulse 91   Temp 98.3 F (36.8 C) (Oral)   Resp 20   LMP 07/17/2022    SpO2 98%   Visual Acuity Right Eye Distance:   Left Eye Distance:   Bilateral Distance:    Right Eye Near:   Left Eye Near:    Bilateral Near:     Physical Exam Vitals and nursing note reviewed.  Constitutional:      Appearance: She is not ill-appearing or toxic-appearing.  HENT:     Head: Normocephalic and atraumatic.     Right Ear: Hearing and external ear normal.     Left Ear: Hearing and external ear normal.     Nose: Nose normal.     Mouth/Throat:     Lips: Pink.  Eyes:     General: Lids are normal. Vision grossly intact. Gaze aligned appropriately.     Extraocular Movements: Extraocular movements intact.     Conjunctiva/sclera: Conjunctivae normal.  Cardiovascular:     Rate and Rhythm: Normal rate and regular rhythm.     Heart sounds: Normal heart sounds, S1 normal and S2 normal.  Pulmonary:     Effort: Pulmonary effort is normal. No respiratory distress.     Breath sounds: Normal breath sounds and air entry.  Musculoskeletal:     Right shoulder: Normal.     Left shoulder: Normal.     Cervical back: Neck supple. Tenderness present. No swelling, edema, deformity, erythema, signs of trauma, lacerations, rigidity, spasms, torticollis, bony tenderness or crepitus. Pain with movement (Tenderness of the left neck elicited with head movement to the right) and muscular tenderness (Tender over multiple points of palpation to the left trapezius muscle in the left cervical paraspinals into the left shoulder) present. No spinous process tenderness. Normal range of motion.     Thoracic back: Normal.     Lumbar back: Normal.     Comments: Strength and sensation intact bilateral upper and lower extremities.  Ambulatory with steady gait without difficulty.  +2 bilateral radial pulses present.  Normal range of motion to the bilateral upper extremities at the shoulder and elbow joints.  Lymphadenopathy:     Cervical: No cervical adenopathy.  Skin:    General: Skin is warm and dry.  Capillary Refill: Capillary refill takes less than 2 seconds.     Findings: No rash.  Neurological:     General: No focal deficit present.     Mental Status: She is alert and oriented to person, place, and time. Mental status is at baseline.     Cranial Nerves: No dysarthria or facial asymmetry.  Psychiatric:        Mood and Affect: Mood normal.        Speech: Speech normal.        Behavior: Behavior normal.        Thought Content: Thought content normal.        Judgment: Judgment normal.      UC Treatments / Results  Labs (all labs ordered are listed, but only abnormal results are displayed) Labs Reviewed - No data to display  EKG   Radiology No results found.  Procedures Procedures (including critical care time)  Medications Ordered in UC Medications  ketorolac (TORADOL) 30 MG/ML injection 30 mg (has no administration in time range)    Initial Impression / Assessment and Plan / UC Course  I have reviewed the triage vital signs and the nursing notes.  Pertinent labs & imaging results that were available during my care of the patient were reviewed by me and considered in my medical decision making (see chart for details).   1.  Strain of left trapezius muscle Evaluation suggests pain is muscular in nature. Will manage this with rest, gentle ROM exercises, heat therapy, ibuprofen as needed for pain, and as needed use of muscle relaxer. Drowsiness precautions discussed regarding muscle relaxer use. Ketorolac 30mg  IM given in clinic (no NSAIDs for 24 hours).  Imaging: no indication for imaging based on stable musculoskeletal exam findings May follow-up with orthopedics as needed.  Discussed red flag signs and symptoms of worsening condition,when to call the PCP office, return to urgent care, and when to seek higher level of care in the emergency department. Counseled patient regarding appropriate use of medications and potential side effects for all medications recommended  or prescribed today. Patient verbalizes understanding and agreement with plan. Discharged in stable condition.     Final Clinical Impressions(s) / UC Diagnoses   Final diagnoses:  Strain of left trapezius muscle, initial encounter     Discharge Instructions      Your pain is likely due to a muscle strain which will improve on its own with time.   - Take ibuprofen with food every 6 hours as needed for pain and inflammation. Do not take any other NSAID containing medicine when taking ibuprofen.   - You may start taking ibuprofen tomorrow since you were given a dose of ketorolac injection in clinic today for pain and inflammation. - You may also take the prescribed muscle relaxer as directed as needed for muscle aches/spasm.  Do not take this medication and drive or drink alcohol as it can make you sleepy.  Mainly use this medicine at nighttime as needed. - Apply heat 20 minutes on then 20 minutes off and perform gentle range of motion exercises to the area of greatest pain to prevent muscle stiffness and provide further pain relief.   Red flag symptoms to watch out for are numbness/tingling to the legs, weakness, loss of bowel/bladder control, and/or worsening pain that does not respond well to medicines. Follow-up with your primary care provider or return to urgent care if your symptoms do not improve in the next 3 to 4 days with medications and  interventions recommended today. If your symptoms are severe (red flag), please go to the emergency room.  I hope you feel better!      ED Prescriptions     Medication Sig Dispense Auth. Provider   ibuprofen (ADVIL) 600 MG tablet Take 1 tablet (600 mg total) by mouth every 6 (six) hours as needed. 30 tablet Carlisle Beers, FNP   methocarbamol (ROBAXIN) 500 MG tablet Take 1 tablet (500 mg total) by mouth 2 (two) times daily. 20 tablet Carlisle Beers, FNP      PDMP not reviewed this encounter.   Carlisle Beers,  FNP 07/26/22 1748    Carlisle Beers, FNP 07/26/22 1749

## 2022-07-26 NOTE — ED Triage Notes (Signed)
Pt states she woke up with left side numbness  that goes from her left shoulder, chest and left leg since this morning. Reports her left breast feels tender.

## 2022-07-26 NOTE — Discharge Instructions (Signed)
Your pain is likely due to a muscle strain which will improve on its own with time.   - Take ibuprofen with food every 6 hours as needed for pain and inflammation. Do not take any other NSAID containing medicine when taking ibuprofen.   - You may start taking ibuprofen tomorrow since you were given a dose of ketorolac injection in clinic today for pain and inflammation. - You may also take the prescribed muscle relaxer as directed as needed for muscle aches/spasm.  Do not take this medication and drive or drink alcohol as it can make you sleepy.  Mainly use this medicine at nighttime as needed. - Apply heat 20 minutes on then 20 minutes off and perform gentle range of motion exercises to the area of greatest pain to prevent muscle stiffness and provide further pain relief.   Red flag symptoms to watch out for are numbness/tingling to the legs, weakness, loss of bowel/bladder control, and/or worsening pain that does not respond well to medicines. Follow-up with your primary care provider or return to urgent care if your symptoms do not improve in the next 3 to 4 days with medications and interventions recommended today. If your symptoms are severe (red flag), please go to the emergency room.  I hope you feel better!

## 2022-09-08 ENCOUNTER — Emergency Department (HOSPITAL_COMMUNITY)
Admission: EM | Admit: 2022-09-08 | Discharge: 2022-09-09 | Disposition: A | Discharge status: Home / Self Care | Payer: 59 | Attending: Emergency Medicine | Admitting: Emergency Medicine

## 2022-09-08 DIAGNOSIS — Z5321 Procedure and treatment not carried out due to patient leaving prior to being seen by health care provider: Secondary | ICD-10-CM | POA: Diagnosis not present

## 2022-09-08 DIAGNOSIS — R519 Headache, unspecified: Secondary | ICD-10-CM | POA: Diagnosis not present

## 2022-09-08 DIAGNOSIS — I10 Essential (primary) hypertension: Secondary | ICD-10-CM | POA: Diagnosis not present

## 2022-09-09 ENCOUNTER — Encounter (HOSPITAL_COMMUNITY): Payer: Self-pay

## 2022-09-09 ENCOUNTER — Other Ambulatory Visit: Payer: Self-pay

## 2022-09-09 DIAGNOSIS — R519 Headache, unspecified: Secondary | ICD-10-CM | POA: Diagnosis not present

## 2022-09-09 LAB — CBC
HCT: 36.6 % (ref 36.0–46.0)
Hemoglobin: 12 g/dL (ref 12.0–15.0)
MCH: 31 pg (ref 26.0–34.0)
MCHC: 32.8 g/dL (ref 30.0–36.0)
MCV: 94.6 fL (ref 80.0–100.0)
Platelets: 319 10*3/uL (ref 150–400)
RBC: 3.87 MIL/uL (ref 3.87–5.11)
RDW: 13.2 % (ref 11.5–15.5)
WBC: 5.6 10*3/uL (ref 4.0–10.5)
nRBC: 0 % (ref 0.0–0.2)

## 2022-09-09 LAB — BASIC METABOLIC PANEL
Anion gap: 14 (ref 5–15)
BUN: 5 mg/dL — ABNORMAL LOW (ref 6–20)
CO2: 24 mmol/L (ref 22–32)
Calcium: 8.9 mg/dL (ref 8.9–10.3)
Chloride: 103 mmol/L (ref 98–111)
Creatinine, Ser: 0.85 mg/dL (ref 0.44–1.00)
GFR, Estimated: >60 mL/min (ref >60)
Glucose, Bld: 99 mg/dL (ref 70–99)
Potassium: 3 mmol/L — ABNORMAL LOW (ref 3.5–5.1)
Sodium: 141 mmol/L (ref 135–145)

## 2022-09-09 LAB — HCG, SERUM, QUALITATIVE: Preg, Serum: NEGATIVE

## 2022-09-09 NOTE — ED Triage Notes (Signed)
Pt arrived from home via POV c/o throbbing headache that began this morning 8/10. Hx HTN

## 2022-09-09 NOTE — ED Notes (Signed)
Pt seen leaving out ED front entrance one hour ago and have not returned.

## 2022-09-17 ENCOUNTER — Telehealth: Payer: Self-pay | Admitting: *Deleted

## 2022-09-17 NOTE — Telephone Encounter (Signed)
Transition Care Management Follow-up Telephone Call Date of discharge and from where: [facility] [place]. [facility]    09/09/2022 How have you been since you were released from the hospital? Feeling better  Any questions or concerns? Yes patient requested information on finding a PCP provided her with that information for resources   Items Reviewed: Did the pt receive and understand the discharge instructions provided?  Patient left before completed visit   Follow up appointments reviewed:  Are transportation arrangements needed? No  If their condition worsens, is the pt aware to call PCP or go to the Emergency Dept.? Yes Was the patient provided with contact information for the PCP's office or ED? Yes Was to pt encouraged to call back with questions or concerns? Yes

## 2022-10-20 ENCOUNTER — Encounter (HOSPITAL_COMMUNITY): Payer: Self-pay | Admitting: Emergency Medicine

## 2022-10-20 ENCOUNTER — Other Ambulatory Visit: Payer: Self-pay

## 2022-10-20 ENCOUNTER — Emergency Department (HOSPITAL_COMMUNITY)
Admission: EM | Admit: 2022-10-20 | Discharge: 2022-10-21 | Disposition: A | Discharge status: Home / Self Care | Payer: 59 | Attending: Emergency Medicine | Admitting: Emergency Medicine

## 2022-10-20 DIAGNOSIS — I10 Essential (primary) hypertension: Secondary | ICD-10-CM | POA: Insufficient documentation

## 2022-10-20 DIAGNOSIS — Z20822 Contact with and (suspected) exposure to covid-19: Secondary | ICD-10-CM | POA: Insufficient documentation

## 2022-10-20 DIAGNOSIS — K029 Dental caries, unspecified: Secondary | ICD-10-CM | POA: Diagnosis not present

## 2022-10-20 DIAGNOSIS — K0889 Other specified disorders of teeth and supporting structures: Secondary | ICD-10-CM

## 2022-10-20 NOTE — ED Triage Notes (Signed)
Presents for R lower and upper dental pain x 2 days. No pain in neck or difficulty swallowing. States that pain is intermittent.  Denies fever, trauma to face

## 2022-10-21 DIAGNOSIS — K029 Dental caries, unspecified: Secondary | ICD-10-CM | POA: Diagnosis not present

## 2022-10-21 LAB — RESP PANEL BY RT-PCR (RSV, FLU A&B, COVID)  RVPGX2
Influenza A by PCR: NEGATIVE
Influenza B by PCR: NEGATIVE
Resp Syncytial Virus by PCR: NEGATIVE
SARS Coronavirus 2 by RT PCR: NEGATIVE

## 2022-10-21 MED ORDER — IBUPROFEN 600 MG PO TABS: 600.0000 mg | ORAL_TABLET | Freq: Four times a day (QID) | ORAL | 0 refills | Status: DC | PRN

## 2022-10-21 MED ORDER — PENICILLIN V POTASSIUM 250 MG PO TABS
500.0000 mg | ORAL_TABLET | Freq: Once | ORAL | Status: AC
  Administered 2022-10-21: 500 mg via ORAL
  Filled 2022-10-21: qty 2

## 2022-10-21 MED ORDER — KETOROLAC TROMETHAMINE 30 MG/ML IJ SOLN
30.0000 mg | Freq: Once | INTRAMUSCULAR | Status: AC
  Administered 2022-10-21: 30 mg via INTRAMUSCULAR
  Filled 2022-10-21: qty 1

## 2022-10-21 MED ORDER — OXYCODONE-ACETAMINOPHEN 5-325 MG PO TABS
1.0000 | ORAL_TABLET | Freq: Once | ORAL | Status: AC
  Administered 2022-10-21: 1 via ORAL
  Filled 2022-10-21: qty 1

## 2022-10-21 MED ORDER — PENICILLIN V POTASSIUM 500 MG PO TABS: 500.0000 mg | ORAL_TABLET | Freq: Four times a day (QID) | ORAL | 0 refills | Status: AC

## 2022-10-21 NOTE — Discharge Instructions (Addendum)
You were seen today for dental pain.  You likely have an underlying infection.  You will be treated with amoxicillin.  Follow-up closely with your dentist.  Regarding your upper respiratory symptoms, COVID testing was sent.  Check MyChart for results.

## 2022-10-21 NOTE — ED Notes (Signed)
Pt verbalized understanding of discharge instructions. Pt ambulated from ed . Family to drive home.

## 2022-10-21 NOTE — ED Provider Notes (Signed)
[place] EMERGENCY DEPARTMENT AT [facility] Provider Note   CSN: [id number] Arrival date & time: 10/20/22  2243     History  Chief Complaint  Patient presents with   Dental Pain    Christina Riggs is a 32 y.o. female.  HPI     This is a 32 year old female who presents with dental pain.  Reports 1 day history of worsening right lower dental pain.  Reports that she has a appointment with a dentist in March.  She has a known dental carry and hole in that tooth.  She has not had any fevers.  No difficulty swallowing.  She does report some upper respiratory symptoms including congestion and runny nose.  She is requesting COVID testing.  Home Medications Prior to Admission medications   Medication Sig Start Date End Date Taking? Authorizing Provider  ibuprofen (ADVIL) 600 MG tablet Take 1 tablet (600 mg total) by mouth every 6 (six) hours as needed. 10/21/22  Yes Fahmida Jurich, Mayer Masker, MD  penicillin v potassium (VEETID) 500 MG tablet Take 1 tablet (500 mg total) by mouth 4 (four) times daily for 10 days. 10/21/22 10/31/22 Yes Addi Pak, Mayer Masker, MD  acetaminophen (TYLENOL) 325 MG tablet Take 650 mg by mouth every 6 (six) hours as needed. Patient not taking: Reported on 09/20/2020    [provider]  amoxicillin (AMOXIL) 875 MG tablet Take 1 tablet (875 mg total) by mouth 2 (two) times daily. 03/25/21   Cuthriell, Delorise Royals, [name]  ibuprofen (ADVIL) 600 MG tablet Take 1 tablet (600 mg total) by mouth every 6 (six) hours as needed. 07/26/22   Carlisle Beers, FNP  magic mouthwash w/lidocaine SOLN Take 5 mLs by mouth 4 (four) times daily. 03/25/21   Cuthriell, Delorise Royals, [name]  methocarbamol (ROBAXIN) 500 MG tablet Take 1 tablet (500 mg total) by mouth 2 (two) times daily. 07/26/22   Carlisle Beers, FNP  misoprostol (CYTOTEC) 200 MCG tablet Place 1 tablet (200 mcg total) vaginally once for 1 dose. At bedtime evening prior to procedure 09/20/20 09/20/20  [name], CNM  Prenatal Vit-Fe Fumarate-FA (PRENATAL MULTIVITAMIN) TABS tablet Take 1 tablet by mouth daily at 12 noon.    [provider]  sertraline (ZOLOFT) 50 MG tablet Take 2 tablets (100 mg total) by mouth daily. Take half a tablet for the first 5-7 days. 09/20/20   [name], CNM      Allergies    Patient has no known allergies.    Review of Systems   Review of Systems  Constitutional:  Negative for fever.  HENT:  Positive for congestion, dental problem and rhinorrhea.   All other systems reviewed and are negative.   Physical Exam Updated Vital Signs BP (!) 146/94   Pulse 63   Temp 97.6 F (36.4 C)   Resp 16   Wt 99.8 kg   LMP 10/18/2022 (Exact Date)   SpO2 100%   BMI 37.76 kg/m  Physical Exam Vitals and nursing note reviewed.  Constitutional:      Appearance: She is well-developed. She is not ill-appearing.  HENT:     Head: Normocephalic and atraumatic.     Mouth/Throat:     Mouth: Mucous membranes are moist.     Comments: Generally poor dentition, caries noted in the right lower premolar, adjacent periapical tenderness, no obvious abscess, no trismus, no fullness under the tongue Eyes:     Pupils: Pupils are equal, round, and reactive to light.  Cardiovascular:     Rate and Rhythm: Normal rate and regular rhythm.  Pulmonary:     Effort: Pulmonary effort is normal. No respiratory distress.  Abdominal:     Palpations: Abdomen is soft.  Musculoskeletal:     Cervical back: Neck supple.  Lymphadenopathy:     Cervical: No cervical adenopathy.  Skin:    General: Skin is warm and dry.  Neurological:     Mental Status: She is alert and oriented to person, place, and time.  Psychiatric:        Mood and Affect: Mood normal.     ED Results / Procedures / Treatments   Labs (all labs ordered are listed, but only abnormal results are displayed) Labs Reviewed  RESP PANEL BY RT-PCR (RSV, FLU A&B, COVID)  [id number]    EKG None  Radiology No  results found.  Procedures Procedures    Medications Ordered in ED Medications  oxyCODONE-acetaminophen (PERCOCET/ROXICET) 5-325 MG per tablet 1 tablet (has no administration in time range)  penicillin v potassium (VEETID) tablet 500 mg (has no administration in time range)  ketorolac (TORADOL) 30 MG/ML injection 30 mg (30 mg Intramuscular Given 10/21/22 0517)    ED Course/ Medical Decision Making/ A&P                                 Medical Decision Making Risk Prescription drug management.   This patient presents to the ED for concern of dental pain, this involves an extensive number of treatment options, and is a complaint that carries with it a high risk of complications and morbidity.  I considered the following differential and admission for this acute, potentially life threatening condition.  The differential diagnosis includes infection, dental carry, trauma  MDM:    This is a 32 year old female who presents with dental pain.  She is overall nontoxic and vital signs are reassuring.  She has tenderness in the periapical region of the right lower premolar.  She has generalized poor dentition.  No signs or symptoms of deep space infection.  Patient was given a dose of penicillin.  She was given a Percocet and Toradol as well.  Will send home with antibiotics for presumed infection.  Additionally will start her on ibuprofen scheduled.  Regarding concern for upper respiratory symptoms, these seem mild and she is afebrile without any respiratory distress.  Will send COVID testing.  Directed her to MyChart for results.  (Labs, imaging, consults)  Labs: I Ordered, and personally interpreted labs.  The pertinent results include: COVID and influenza testing  Imaging Studies ordered: I ordered imaging studies including none I independently visualized and interpreted imaging. I agree with the radiologist interpretation  Additional history obtained from chart review.  External records  from outside source obtained and reviewed including prior evaluations  Cardiac Monitoring: The patient was not maintained on a cardiac monitor.  If on the cardiac monitor, I personally viewed and interpreted the cardiac monitored which showed an underlying rhythm of: N/A  Reevaluation: After the interventions noted above, I reevaluated the patient and found that they have :stayed the same  Social Determinants of Health:  lives independently  Disposition: Discharge  Co morbidities that complicate the patient evaluation  Past Medical History:  Diagnosis Date   Fracture of foot    Right foot fracture 10/2019   Hypertension    per pt report     Medicines Meds ordered this encounter  Medications   ketorolac (TORADOL) 30 MG/ML injection 30 mg   oxyCODONE-acetaminophen (PERCOCET/ROXICET) 5-325 MG per tablet 1 tablet   penicillin v potassium (VEETID) tablet 500 mg   penicillin v potassium (VEETID) 500 MG tablet    Sig: Take 1 tablet (500 mg total) by mouth 4 (four) times daily for 10 days.    Dispense:  40 tablet    Refill:  0   ibuprofen (ADVIL) 600 MG tablet    Sig: Take 1 tablet (600 mg total) by mouth every 6 (six) hours as needed.    Dispense:  30 tablet    Refill:  0    I have reviewed the patients home medicines and have made adjustments as needed  Problem List / ED Course: Problem List Items Addressed This Visit   None Visit Diagnoses     Pain, dental    -  Primary                   Final Clinical Impression(s) / ED Diagnoses Final diagnoses:  Pain, dental    Rx / DC Orders ED Discharge Orders          Ordered    penicillin v potassium (VEETID) 500 MG tablet  4 times daily        10/21/22 0521    ibuprofen (ADVIL) 600 MG tablet  Every 6 hours PRN        10/21/22 0521              Shon Baton, MD 10/21/22 [phone]

## 2022-12-07 ENCOUNTER — Encounter (HOSPITAL_COMMUNITY): Payer: Self-pay | Admitting: Emergency Medicine

## 2022-12-07 ENCOUNTER — Emergency Department (HOSPITAL_COMMUNITY)
Admission: EM | Admit: 2022-12-07 | Discharge: 2022-12-08 | Disposition: A | Discharge status: Home / Self Care | Payer: 59 | Attending: Emergency Medicine | Admitting: Emergency Medicine

## 2022-12-07 DIAGNOSIS — H6121 Impacted cerumen, right ear: Secondary | ICD-10-CM | POA: Insufficient documentation

## 2022-12-07 DIAGNOSIS — H9201 Otalgia, right ear: Secondary | ICD-10-CM | POA: Diagnosis present

## 2022-12-07 NOTE — ED Triage Notes (Signed)
Pt here from home with c/o ear fullness to the right ear no fevers

## 2022-12-08 DIAGNOSIS — H6121 Impacted cerumen, right ear: Secondary | ICD-10-CM | POA: Diagnosis not present

## 2022-12-08 MED ORDER — DOCUSATE SODIUM 50 MG/5ML PO LIQD
50.0000 mg | Freq: Once | ORAL | Status: AC
  Administered 2022-12-08: 50 mg via OTIC
  Filled 2022-12-08: qty 10

## 2022-12-08 NOTE — ED Provider Notes (Signed)
[place] EMERGENCY DEPARTMENT AT [facility] Provider Note   CSN: [id number] Arrival date & time: 12/07/22  1934     History  Chief Complaint  Patient presents with   Ear Fullness    Christina Riggs is a 32 y.o. female.  Patient with history of hypertension presents today with complaints of right ear pain.  She states that same began earlier today and has been persistent since then.  Denies any history of similar symptoms previously.  Denies cough, congestion, sore throat, fevers, or chills.  Does note that her hearing is muffled on that side.  She denies using Q-tips.  No trauma, headaches, vision changes.  The history is provided by the patient. No language interpreter was used.  Ear Fullness       Home Medications Prior to Admission medications   Medication Sig Start Date End Date Taking? Authorizing Provider  acetaminophen (TYLENOL) 325 MG tablet Take 650 mg by mouth every 6 (six) hours as needed. Patient not taking: Reported on 09/20/2020    [provider]  amoxicillin (AMOXIL) 875 MG tablet Take 1 tablet (875 mg total) by mouth 2 (two) times daily. 03/25/21   Cuthriell, Delorise Royals, [name]  ibuprofen (ADVIL) 600 MG tablet Take 1 tablet (600 mg total) by mouth every 6 (six) hours as needed. 07/26/22   Carlisle Beers, FNP  ibuprofen (ADVIL) 600 MG tablet Take 1 tablet (600 mg total) by mouth every 6 (six) hours as needed. 10/21/22   Horton, Mayer Masker, MD  magic mouthwash w/lidocaine SOLN Take 5 mLs by mouth 4 (four) times daily. 03/25/21   Cuthriell, Delorise Royals, [name]  methocarbamol (ROBAXIN) 500 MG tablet Take 1 tablet (500 mg total) by mouth 2 (two) times daily. 07/26/22   Carlisle Beers, FNP  misoprostol (CYTOTEC) 200 MCG tablet Place 1 tablet (200 mcg total) vaginally once for 1 dose. At bedtime evening prior to procedure 09/20/20 09/20/20  [name], CNM  Prenatal Vit-Fe Fumarate-FA (PRENATAL MULTIVITAMIN) TABS tablet Take 1 tablet by  mouth daily at 12 noon.    [provider]  sertraline (ZOLOFT) 50 MG tablet Take 2 tablets (100 mg total) by mouth daily. Take half a tablet for the first 5-7 days. 09/20/20   [name], CNM      Allergies    Patient has no known allergies.    Review of Systems   Review of Systems  HENT:  Positive for ear pain.   All other systems reviewed and are negative.   Physical Exam Updated Vital Signs BP 136/86 (BP Location: Right Arm)   Pulse 75   Temp 98.1 F (36.7 C) (Oral)   Resp 18   SpO2 99%  Physical Exam Vitals and nursing note reviewed.  Constitutional:      General: She is not in acute distress.    Appearance: Normal appearance. She is normal weight. She is not ill-appearing, toxic-appearing or diaphoretic.  HENT:     Head: Normocephalic and atraumatic.     Left Ear: Tympanic membrane, ear canal and external ear normal.     Ears:     Comments: Cerumen impaction on the right.  No canal swelling, erythema, or drainage.  Negative tug test.  No mastoid tenderness.    Mouth/Throat:     Mouth: Mucous membranes are moist.     Pharynx: No oropharyngeal exudate or posterior oropharyngeal erythema.  Eyes:     Extraocular Movements: Extraocular movements intact.  Pupils: Pupils are equal, round, and reactive to light.  Cardiovascular:     Rate and Rhythm: Normal rate.  Pulmonary:     Effort: Pulmonary effort is normal. No respiratory distress.  Musculoskeletal:        General: Normal range of motion.     Cervical back: Normal range of motion and neck supple.  Skin:    General: Skin is warm and dry.  Neurological:     General: No focal deficit present.     Mental Status: She is alert.  Psychiatric:        Mood and Affect: Mood normal.        Behavior: Behavior normal.     ED Results / Procedures / Treatments   Labs (all labs ordered are listed, but only abnormal results are displayed) Labs Reviewed - No data to display  EKG None  Radiology No  results found.  Procedures .Ear Cerumen Removal  Date/Time: 12/08/2022 3:32 AM  Performed by: Silva Bandy, [name] Authorized by: Silva Bandy, [name]   Consent:    Consent obtained:  Verbal   Consent given by:  Patient   Risks, benefits, and alternatives were discussed: yes     Risks discussed:  Bleeding, dizziness, incomplete removal, infection, pain and TM perforation   Alternatives discussed:  No treatment, delayed treatment, alternative treatment, observation and referral Universal protocol:    Procedure explained and questions answered to patient or proxy's satisfaction: yes     Patient identity confirmed:  Verbally with patient Procedure details:    Location:  R ear   Procedure type: irrigation     Procedure outcomes: cerumen removed   Post-procedure details:    Inspection:  Some cerumen remaining and TM intact   Hearing quality:  Normal   Procedure completion:  Tolerated well, no immediate complications     Medications Ordered in ED Medications  docusate (COLACE) 50 MG/5ML liquid 50 mg (50 mg Right EAR Given 12/08/22 0117)    ED Course/ Medical Decision Making/ A&P                                 Medical Decision Making Risk OTC drugs.   Patient presents today with cerumen impaction on the right side x 1 day.  She is afebrile, nontoxic-appearing, and in no acute distress reassuring vital signs.  Physical exam reveals no signs of mastoiditis, otitis externa, or otitis media.  No infectious signs.  After cerumen disimpaction per above procedure, patient feeling better and ready to go home.  Recommend that she use Debrox drops OTC for any remaining wax in the canal and follow-up closely with her primary doctor. Evaluation and diagnostic testing in the emergency department does not suggest an emergent condition requiring admission or immediate intervention beyond what has been performed at this time.  Plan for discharge with close PCP follow-up.  Patient is  understanding and amenable with plan, educated on red flag symptoms that would prompt immediate return.  Patient discharged in stable condition.  Final Clinical Impression(s) / ED Diagnoses Final diagnoses:  Impacted cerumen of right ear    Rx / DC Orders ED Discharge Orders     None     An After Visit Summary was printed and given to the patient.     Vear Clock 12/08/22 0335    Glynn Octave, MD 12/08/22 [phone]

## 2022-12-08 NOTE — Discharge Instructions (Addendum)
As we discussed, we did remove the earwax from your right ear.  Some wax does remain and I recommend that you go to your local pharmacy and get Debrox drops.  Your ear does not appear infected and therefore no antibiotics are indicated.  This does not require a prescription.  Please avoid using Q-tips in the future.  Follow-up with your primary doctor closely.  Return if development of any new or worsening symptoms.

## 2023-03-08 ENCOUNTER — Ambulatory Visit (HOSPITAL_COMMUNITY)
Admission: EM | Admit: 2023-03-08 | Discharge: 2023-03-08 | Disposition: A | Discharge status: Home / Self Care | Payer: 59 | Attending: Internal Medicine | Admitting: Internal Medicine

## 2023-03-08 ENCOUNTER — Encounter (HOSPITAL_COMMUNITY): Payer: Self-pay

## 2023-03-08 DIAGNOSIS — J101 Influenza due to other identified influenza virus with other respiratory manifestations: Secondary | ICD-10-CM | POA: Diagnosis not present

## 2023-03-08 DIAGNOSIS — J0100 Acute maxillary sinusitis, unspecified: Secondary | ICD-10-CM

## 2023-03-08 LAB — POC COVID19/FLU A&B COMBO
Covid Antigen, POC: NEGATIVE
Influenza A Antigen, POC: POSITIVE — AB
Influenza B Antigen, POC: NEGATIVE

## 2023-03-08 MED ORDER — PREDNISONE 20 MG PO TABS: 40.0000 mg | ORAL_TABLET | Freq: Every day | ORAL | 0 refills | Status: AC

## 2023-03-08 MED ORDER — FLUTICASONE PROPIONATE 50 MCG/ACT NA SUSP: 2.0000 | Freq: Every day | NASAL | 2 refills | Status: DC

## 2023-03-08 NOTE — Discharge Instructions (Addendum)
 Flu A, flu B and COVID testing done today.  Influenza A is positive.  This is a virus and does not require antibiotics.  We treat the symptoms.  We will treat this with the following: Prednisone 40 mg (2 tablets) once daily for 5 days. Take this in the morning.  This is a steroid to help with inflammation and pain.  Flonase 2 sprays each nostril once daily for 7 days for nasal congestion.  May use as needed after this.  Rest and stay hydrated.    Return to urgent care or PCP if symptoms worsen or fail to resolve.   Blood pressure was addressed during visit and recommended patient monitor this at home. Referral placed for PCP.

## 2023-03-08 NOTE — ED Triage Notes (Signed)
 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   Onset: 2 days   Prescriptions or OTC medications tried: Yes- Ibuprofen, Tylenol, Theraflu    with little relief  New foods, medications, or products: No  Recent Travel: No

## 2023-03-08 NOTE — ED Provider Notes (Signed)
 [facility]    CSN: [id number] Arrival date & time: 03/08/23  1240      History   Chief Complaint Chief Complaint  Patient presents with   Nasal Congestion    HPI Ariyonna Merrilyn Puma is a 33 y.o. female.   33 year old female who presents to urgent care with complaints of headache, body aches, nasal congestion and some dizziness at times.  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 Rare cough and mild chills but no fevers. Drinking lots of water but not a great appetite.  Her symptoms started about 2 days ago.  The worst symptom that she has at this point is headache and nasal congestion along with the dizziness.     Past Medical History:  Diagnosis Date   Fracture of foot    Right foot fracture 10/2019   Hypertension    per pt report    Patient Active Problem List   Diagnosis Date Noted   ASCUS of cervix with negative high risk HPV 09/27/2020   History of depressive symptoms 09/20/2020   Postpartum care following vaginal delivery 05/05/2020    Past Surgical History:  Procedure Laterality Date   NO PAST SURGERIES      OB History     Gravida  3   Para  3   Term  2   Preterm  1   AB  0   Living  1      SAB  0   IAB  0   Ectopic  0   Multiple      Live Births  1            Home Medications    Prior to Admission medications   Medication Sig Start Date End Date Taking? Authorizing Provider  acetaminophen (TYLENOL) 325 MG tablet Take 650 mg by mouth every 6 (six) hours as needed.   Yes [provider]  ibuprofen (ADVIL) 600 MG tablet Take 1 tablet (600 mg total) by mouth every 6 (six) hours as needed. 07/26/22  Yes Carlisle Beers, FNP  amoxicillin (AMOXIL) 875 MG tablet Take 1 tablet (875 mg total) by mouth 2 (two) times daily. 03/25/21   Cuthriell, Delorise Royals, [name]  ibuprofen (ADVIL) 600 MG tablet Take 1 tablet (600 mg total) by mouth every 6 (six) hours as needed. 10/21/22   Horton,  Mayer Masker, MD  magic mouthwash w/lidocaine SOLN Take 5 mLs by mouth 4 (four) times daily. 03/25/21   Cuthriell, Delorise Royals, [name]  methocarbamol (ROBAXIN) 500 MG tablet Take 1 tablet (500 mg total) by mouth 2 (two) times daily. 07/26/22   Carlisle Beers, FNP  misoprostol (CYTOTEC) 200 MCG tablet Place 1 tablet (200 mcg total) vaginally once for 1 dose. At bedtime evening prior to procedure 09/20/20 09/20/20  [name], CNM  Prenatal Vit-Fe Fumarate-FA (PRENATAL MULTIVITAMIN) TABS tablet Take 1 tablet by mouth daily at 12 noon.    [provider]  sertraline (ZOLOFT) 50 MG tablet Take 2 tablets (100 mg total) by mouth daily. Take half a tablet for the first 5-7 days. 09/20/20   [name], CNM    Family History Family History  Problem Relation Age of Onset   Hypertension Father    Stroke Father     Social History Social History   Tobacco Use   Smoking status: Former    Types: Cigars   Smokeless tobacco: Never   Tobacco comments:    1  day  Vaping Use   Vaping status: Every Day  Substance Use Topics   Alcohol use: Yes   Drug use: No     Allergies   Patient has no known allergies.   Review of Systems Review of Systems  Constitutional:  Positive for chills. Negative for fever.  HENT:  Positive for congestion. Negative for ear pain and sore throat.   Eyes:  Negative for pain and visual disturbance.  Respiratory:  Positive for cough (mild). Negative for shortness of breath.   Cardiovascular:  Negative for chest pain and palpitations.  Gastrointestinal:  Negative for abdominal pain and vomiting.  Genitourinary:  Negative for dysuria and hematuria.  Musculoskeletal:  Negative for arthralgias and back pain.       Generalized body aches  Skin:  Negative for color change and rash.  Neurological:  Positive for headaches. Negative for seizures and syncope.  All other systems reviewed and are negative.    Physical Exam Triage Vital Signs ED Triage Vitals   Encounter Vitals Group     BP 03/08/23 1416 (!) 148/97     Systolic BP Percentile --      Diastolic BP Percentile --      Pulse Rate 03/08/23 1416 94     Resp 03/08/23 1416 18     Temp 03/08/23 1416 98.9 F (37.2 C)     Temp Source 03/08/23 1416 Oral     SpO2 03/08/23 1416 95 %     Weight --      Height --      Head Circumference --      Peak Flow --      Pain Score 03/08/23 1414 8     Pain Loc --      Pain Education --      Exclude from Growth Chart --    No data found.  Updated Vital Signs BP (!) 148/97 (BP Location: Left Arm)   Pulse 94   Temp 98.9 F (37.2 C) (Oral)   Resp 18   LMP 02/19/2023 (Approximate)   SpO2 95%   Breastfeeding No   Visual Acuity Right Eye Distance:   Left Eye Distance:   Bilateral Distance:    Right Eye Near:   Left Eye Near:    Bilateral Near:     Physical Exam Vitals and nursing note reviewed.  Constitutional:      General: She is not in acute distress.    Appearance: She is well-developed.  HENT:     Head: Normocephalic and atraumatic.     Nose: Congestion present.     Mouth/Throat:     Mouth: Mucous membranes are moist.  Eyes:     Conjunctiva/sclera: Conjunctivae normal.  Cardiovascular:     Rate and Rhythm: Normal rate and regular rhythm.     Heart sounds: No murmur heard. Pulmonary:     Effort: Pulmonary effort is normal. No respiratory distress.     Breath sounds: Normal breath sounds.  Abdominal:     Palpations: Abdomen is soft.     Tenderness: There is no abdominal tenderness.  Musculoskeletal:        General: No swelling.     Cervical back: Neck supple.  Skin:    General: Skin is warm and dry.     Capillary Refill: Capillary refill takes less than 2 seconds.  Neurological:     Mental Status: She is alert.  Psychiatric:        Mood and Affect: Mood normal.  UC Treatments / Results  Labs (all labs ordered are listed, but only abnormal results are displayed) Labs Reviewed  POC COVID19/FLU A&B COMBO     EKG   Radiology No results found.  Procedures Procedures (including critical care time)  Medications Ordered in UC Medications - No data to display  Initial Impression / Assessment and Plan / UC Course  I have reviewed the triage vital signs and the nursing notes.  Pertinent labs & imaging results that were available during my care of the patient were reviewed by me and considered in my medical decision making (see chart for details).     Influenza A   Flu A, flu B and COVID testing done today.  Influenza A is positive.  This is a virus and does not require antibiotics.  We treat the symptoms.  We will treat this with the following: Prednisone 40 mg (2 tablets) once daily for 5 days. Take this in the morning.  This is a steroid to help with inflammation and pain.  Flonase 2 sprays each nostril once daily for 7 days for nasal congestion.  May use as needed after this.  Rest and stay hydrated.    Return to urgent care or PCP if symptoms worsen or fail to resolve.   Blood pressure was addressed during visit and recommended patient monitor this at home. Referral placed for PCP.    Final Clinical Impressions(s) / UC Diagnoses   Final diagnoses:  None   Discharge Instructions   None    ED Prescriptions   None    PDMP not reviewed this encounter.   Landis Martins, [place] 03/08/23 1455

## 2023-04-12 ENCOUNTER — Emergency Department (HOSPITAL_COMMUNITY)
Admission: EM | Admit: 2023-04-12 | Discharge: 2023-04-12 | Disposition: A | Discharge status: Home / Self Care | Attending: Student | Admitting: Student

## 2023-04-12 ENCOUNTER — Encounter (HOSPITAL_COMMUNITY): Payer: Self-pay | Admitting: *Deleted

## 2023-04-12 ENCOUNTER — Other Ambulatory Visit: Payer: Self-pay

## 2023-04-12 ENCOUNTER — Emergency Department (HOSPITAL_COMMUNITY)

## 2023-04-12 DIAGNOSIS — S0990XA Unspecified injury of head, initial encounter: Secondary | ICD-10-CM | POA: Diagnosis not present

## 2023-04-12 DIAGNOSIS — W108XXA Fall (on) (from) other stairs and steps, initial encounter: Secondary | ICD-10-CM | POA: Diagnosis not present

## 2023-04-12 DIAGNOSIS — S93432A Sprain of tibiofibular ligament of left ankle, initial encounter: Secondary | ICD-10-CM | POA: Diagnosis not present

## 2023-04-12 DIAGNOSIS — M7989 Other specified soft tissue disorders: Secondary | ICD-10-CM | POA: Diagnosis not present

## 2023-04-12 DIAGNOSIS — S99912A Unspecified injury of left ankle, initial encounter: Secondary | ICD-10-CM | POA: Diagnosis present

## 2023-04-12 DIAGNOSIS — Y92009 Unspecified place in unspecified non-institutional (private) residence as the place of occurrence of the external cause: Secondary | ICD-10-CM | POA: Diagnosis not present

## 2023-04-12 DIAGNOSIS — S93492A Sprain of other ligament of left ankle, initial encounter: Secondary | ICD-10-CM

## 2023-04-12 MED ORDER — KETOROLAC TROMETHAMINE 15 MG/ML IJ SOLN
15.0000 mg | Freq: Once | INTRAMUSCULAR | Status: AC
  Administered 2023-04-12: 15 mg via INTRAMUSCULAR
  Filled 2023-04-12: qty 1

## 2023-04-12 MED ORDER — ACETAMINOPHEN 325 MG PO TABS
650.0000 mg | ORAL_TABLET | Freq: Once | ORAL | Status: AC
  Administered 2023-04-12: 650 mg via ORAL
  Filled 2023-04-12: qty 2

## 2023-04-12 NOTE — ED Triage Notes (Signed)
 The pt fell at home approx 1600  she fell down some steps  she has lt ankle pain with sle swelling and she reports that she struck her head  no loc  c/o a headache  lmp march 22

## 2023-04-12 NOTE — Discharge Instructions (Addendum)
 Please use Tylenol or ibuprofen for pain.  You may use 600 mg ibuprofen every 6 hours or 1000 mg of Tylenol every 6 hours.  You may choose to alternate between the 2.  This would be most effective.  Not to exceed 4 g of Tylenol within 24 hours.  Not to exceed 3200 mg ibuprofen 24 hours.  In addition to the above I recommend rest, ice, compression, elevation of the affected extremity while you are not actively standing on it.  I have attached some rehab exercises.  Please follow-up with the orthopedic physician whose contact information I provided if you are not having significant improvement of your ankle pain by around 1 to 2 weeks.  Given your head injury you may have some lingering headache, nausea, brain fog, is possible that you have a very mild concussion, generally symptoms resolved in around 24 to 48 hours.

## 2023-04-12 NOTE — ED Provider Notes (Signed)
 [place] EMERGENCY DEPARTMENT AT [facility] Provider Note   CSN: [id number] Arrival date & time: 04/12/23  1752     History  Chief Complaint  Patient presents with   Christina Riggs is a 33 y.o. female with overall noncontributory past medical history presents with concern for mechanical, nonsyncopal fall, head injury, and left ankle injury.,  Reports difficult walking on the left ankle since the injury.  Denies any loss of consciousness with head injury, denies nausea, vomiting, confusion, vision changes.  Does not take a blood thinner.   Fall       Home Medications Prior to Admission medications   Medication Sig Start Date End Date Taking? Authorizing Provider  acetaminophen (TYLENOL) 325 MG tablet Take 650 mg by mouth every 6 (six) hours as needed.    [provider]  amoxicillin (AMOXIL) 875 MG tablet Take 1 tablet (875 mg total) by mouth 2 (two) times daily. 03/25/21   Cuthriell, Delorise Royals, [name]  fluticasone (FLONASE) 50 MCG/ACT nasal spray Place 2 sprays into both nostrils daily. 03/08/23   White, Elizabeth A, [name]  ibuprofen (ADVIL) 600 MG tablet Take 1 tablet (600 mg total) by mouth every 6 (six) hours as needed. 07/26/22   Carlisle Beers, FNP  ibuprofen (ADVIL) 600 MG tablet Take 1 tablet (600 mg total) by mouth every 6 (six) hours as needed. 10/21/22   Horton, Mayer Masker, MD  magic mouthwash w/lidocaine SOLN Take 5 mLs by mouth 4 (four) times daily. 03/25/21   Cuthriell, Delorise Royals, [name]  methocarbamol (ROBAXIN) 500 MG tablet Take 1 tablet (500 mg total) by mouth 2 (two) times daily. 07/26/22   Carlisle Beers, FNP  misoprostol (CYTOTEC) 200 MCG tablet Place 1 tablet (200 mcg total) vaginally once for 1 dose. At bedtime evening prior to procedure 09/20/20 09/20/20  [name], CNM  Prenatal Vit-Fe Fumarate-FA (PRENATAL MULTIVITAMIN) TABS tablet Take 1 tablet by mouth daily at 12 noon.    [provider]  sertraline  (ZOLOFT) 50 MG tablet Take 2 tablets (100 mg total) by mouth daily. Take half a tablet for the first 5-7 days. 09/20/20   [name], CNM      Allergies    Patient has no known allergies.    Review of Systems   Review of Systems  All other systems reviewed and are negative.   Physical Exam Updated Vital Signs BP 106/61 (BP Location: Right Arm)   Pulse 83   Temp 98.4 F (36.9 C)   Resp 17   Ht 5\' 4"  (1.626 m)   Wt 99.8 kg   LMP 04/10/2023   SpO2 100%   BMI 37.77 kg/m  Physical Exam Vitals and nursing note reviewed.  Constitutional:      General: She is not in acute distress.    Appearance: Normal appearance.  HENT:     Head: Normocephalic and atraumatic.  Eyes:     General:        Right eye: No discharge.        Left eye: No discharge.  Cardiovascular:     Rate and Rhythm: Normal rate and regular rhythm.     Pulses: Normal pulses.     Heart sounds: No murmur heard.    No friction rub. No gallop.  Pulmonary:     Effort: Pulmonary effort is normal.     Breath sounds: Normal breath sounds.  Abdominal:     General: Bowel sounds are  normal.     Palpations: Abdomen is soft.  Musculoskeletal:     Comments: Ttp and soft tissue swelling along ATFL distrubution of left ankle. No step off, deformity. Intact ROM passive to dorsiflexion, plantarflexion. Decreased strength actively 2/2 pain.   Skin:    General: Skin is warm and dry.     Capillary Refill: Capillary refill takes less than 2 seconds.  Neurological:     Mental Status: She is alert and oriented to person, place, and time.     Comments: Cranial nerves II through XII grossly intact.  Intact finger-nose, intact heel-to-shin.  Romberg negative, gait normal.  Alert and oriented x3.  Moves all 4 limbs spontaneously, normal coordination.  No pronator drift.  Intact strength 5 out of 5 bilateral upper and lower extremities.  Psychiatric:        Mood and Affect: Mood normal.        Behavior: Behavior normal.      ED Results / Procedures / Treatments   Labs (all labs ordered are listed, but only abnormal results are displayed) Labs Reviewed - No data to display  EKG None  Radiology DG Ankle Complete Left Result Date: 04/12/2023 CLINICAL DATA:  Fall and pain EXAM: LEFT ANKLE COMPLETE - 3+ VIEW COMPARISON:  None Available. FINDINGS: Bimalleolar soft tissue swelling could be partially due to patient body habitus. No acute fracture or dislocation. Base of fifth metatarsal and talar dome intact. IMPRESSION: Soft tissue swelling only. Electronically Signed   By: Jeronimo Greaves M.D.   On: 04/12/2023 18:55    Procedures Procedures    Medications Ordered in ED Medications  acetaminophen (TYLENOL) tablet 650 mg (650 mg Oral Given 04/12/23 1813)  ketorolac (TORADOL) 15 MG/ML injection 15 mg (15 mg Intramuscular Given 04/12/23 1917)    ED Course/ Medical Decision Making/ A&P                                 Medical Decision Making  This patient is a 33 y.o. female who presents to the ED for concern of fall, ankle injury, head injury.   Differential diagnoses prior to evaluation: epidural hematoma, subdural hematoma, skull fracture, subarachnoid hemorrhage, unstable cervical spine fracture, concussion vs other MSK injury, ankle sprain, fracture, dislocation, vs other  Past Medical History / Social History / Additional history: Chart reviewed. Pertinent results include: overall noncontributory  Physical Exam: Physical exam performed. The pertinent findings include: Mild hypertension in the ED, blood pressure 138/94.  Vital signs otherwise stable.  No focal neurologic deficits.   Ttp and soft tissue swelling along ATFL distrubution of left ankle. No step off, deformity. Intact ROM passive to dorsiflexion, plantarflexion. Decreased strength actively 2/2 pain.    DP, PT pulses 2+ in the affected left lower extremity.  Medications / Treatment: Ankle brace, Toradol shot, encouraged ibuprofen,  Tylenol, RICE, she is negative using the [place] head CT trauma rule for need for any additional advanced imaging at this time.   Disposition: After consideration of the diagnostic results and the patients response to treatment, I feel that patient is stable for discharge at this time with plan as above, orthopedic follow up encouraged.   emergency department workup does not suggest an emergent condition requiring admission or immediate intervention beyond what has been performed at this time. The plan is: as above. The patient is safe for discharge and has been instructed to return immediately for worsening symptoms, change in  symptoms or any other concerns.  Final Clinical Impression(s) / ED Diagnoses Final diagnoses:  Sprain of anterior talofibular ligament of left ankle, initial encounter  Injury of head, initial encounter    Rx / DC Orders ED Discharge Orders     None         [place] 04/12/23 1924    Glendora Score, MD 04/13/23 [phone]

## 2023-04-12 NOTE — ED Notes (Signed)
..  The patient is A&OX4, wheeled out of ED via wheelchair, NAD. Pt verbalized understanding of d/c instructions and follow up care.

## 2023-10-04 ENCOUNTER — Other Ambulatory Visit: Payer: Self-pay

## 2023-10-04 ENCOUNTER — Emergency Department (HOSPITAL_COMMUNITY)

## 2023-10-04 ENCOUNTER — Encounter (HOSPITAL_COMMUNITY): Payer: Self-pay

## 2023-10-04 ENCOUNTER — Emergency Department (HOSPITAL_COMMUNITY)
Admission: EM | Admit: 2023-10-04 | Discharge: 2023-10-04 | Disposition: A | Discharge status: Home / Self Care | Attending: Emergency Medicine | Admitting: Emergency Medicine

## 2023-10-04 DIAGNOSIS — R531 Weakness: Secondary | ICD-10-CM | POA: Diagnosis not present

## 2023-10-04 DIAGNOSIS — O26891 Other specified pregnancy related conditions, first trimester: Secondary | ICD-10-CM | POA: Insufficient documentation

## 2023-10-04 DIAGNOSIS — Z3A01 Less than 8 weeks gestation of pregnancy: Secondary | ICD-10-CM | POA: Diagnosis not present

## 2023-10-04 DIAGNOSIS — R0981 Nasal congestion: Secondary | ICD-10-CM | POA: Diagnosis not present

## 2023-10-04 DIAGNOSIS — R0602 Shortness of breath: Secondary | ICD-10-CM | POA: Insufficient documentation

## 2023-10-04 LAB — I-STAT CHEM 8, ED
BUN: 4 mg/dL — ABNORMAL LOW (ref 6–20)
Calcium, Ion: 1.12 mmol/L — ABNORMAL LOW (ref 1.15–1.40)
Chloride: 100 mmol/L (ref 98–111)
Creatinine, Ser: 0.7 mg/dL (ref 0.44–1.00)
Glucose, Bld: 104 mg/dL — ABNORMAL HIGH (ref 70–99)
HCT: 35 % — ABNORMAL LOW (ref 36.0–46.0)
Hemoglobin: 11.9 g/dL — ABNORMAL LOW (ref 12.0–15.0)
Potassium: 3.6 mmol/L (ref 3.5–5.1)
Sodium: 137 mmol/L (ref 135–145)
TCO2: 23 mmol/L (ref 22–32)

## 2023-10-04 LAB — COMPREHENSIVE METABOLIC PANEL WITH GFR
ALT: 13 U/L (ref 0–44)
AST: 13 U/L — ABNORMAL LOW (ref 15–41)
Albumin: 3.3 g/dL — ABNORMAL LOW (ref 3.5–5.0)
Alkaline Phosphatase: 44 U/L (ref 38–126)
Anion gap: 10 (ref 5–15)
BUN: 5 mg/dL — ABNORMAL LOW (ref 6–20)
CO2: 23 mmol/L (ref 22–32)
Calcium: 8.6 mg/dL — ABNORMAL LOW (ref 8.9–10.3)
Chloride: 102 mmol/L (ref 98–111)
Creatinine, Ser: 0.78 mg/dL (ref 0.44–1.00)
GFR, Estimated: >60 mL/min (ref >60)
Glucose, Bld: 107 mg/dL — ABNORMAL HIGH (ref 70–99)
Potassium: 3.9 mmol/L (ref 3.5–5.1)
Sodium: 135 mmol/L (ref 135–145)
Total Bilirubin: 0.3 mg/dL (ref 0.0–1.2)
Total Protein: 6.8 g/dL (ref 6.5–8.1)

## 2023-10-04 LAB — CBC WITH DIFFERENTIAL/PLATELET
Abs Immature Granulocytes: 0.01 K/uL (ref 0.00–0.07)
Basophils Absolute: 0 K/uL (ref 0.0–0.1)
Basophils Relative: 0 %
Eosinophils Absolute: 0 K/uL (ref 0.0–0.5)
Eosinophils Relative: 1 %
HCT: 34.3 % — ABNORMAL LOW (ref 36.0–46.0)
Hemoglobin: 11.5 g/dL — ABNORMAL LOW (ref 12.0–15.0)
Immature Granulocytes: 0 %
Lymphocytes Relative: 37 %
Lymphs Abs: 1.9 K/uL (ref 0.7–4.0)
MCH: 30.8 pg (ref 26.0–34.0)
MCHC: 33.5 g/dL (ref 30.0–36.0)
MCV: 92 fL (ref 80.0–100.0)
Monocytes Absolute: 0.3 K/uL (ref 0.1–1.0)
Monocytes Relative: 7 %
Neutro Abs: 2.9 K/uL (ref 1.7–7.7)
Neutrophils Relative %: 55 %
Platelets: 329 K/uL (ref 150–400)
RBC: 3.73 MIL/uL — ABNORMAL LOW (ref 3.87–5.11)
RDW: 13 % (ref 11.5–15.5)
WBC: 5.2 K/uL (ref 4.0–10.5)
nRBC: 0 % (ref 0.0–0.2)

## 2023-10-04 LAB — RESP PANEL BY RT-PCR (RSV, FLU A&B, COVID)  RVPGX2
Influenza A by PCR: NEGATIVE
Influenza B by PCR: NEGATIVE
Resp Syncytial Virus by PCR: NEGATIVE
SARS Coronavirus 2 by RT PCR: NEGATIVE

## 2023-10-04 LAB — TROPONIN I (HIGH SENSITIVITY)
Troponin I (High Sensitivity): 4 ng/L (ref <18)
Troponin I (High Sensitivity): 4 ng/L (ref <18)

## 2023-10-04 LAB — PREGNANCY, URINE: Preg Test, Ur: POSITIVE — AB

## 2023-10-04 LAB — HCG, SERUM, QUALITATIVE: Preg, Serum: POSITIVE — AB

## 2023-10-04 NOTE — ED Triage Notes (Signed)
 Patient arrives from home feeling weak and short of breath. Patient is also feeling lightheaded. Patient is also concerned about being pregnant. States last menstrual cycle was July 18. Patient has not seen an OBGYN yet.

## 2023-10-04 NOTE — Discharge Instructions (Signed)
 I have given you referral to OB/GYN.  Please call and schedule an appointment to be can get an ultrasound.  Continue drinking plenty of fluids.  You can take over-the-counter Tylenol .  As we discussed, no evidence of pneumonia or COVID, flu, or RSV today.  You may return to the emergency department for any worsening symptoms.

## 2023-10-04 NOTE — ED Provider Notes (Signed)
 [place] EMERGENCY DEPARTMENT AT [facility] Provider Note   CSN: [id number] Arrival date & time: 10/04/23  0535     Patient presents with: Shortness of Breath and Possible Pregnancy   Christina Riggs is a 33 y.o. female patient G4 P3 Ab0 who presents to the emergency apartment today for further evaluation of generalized weakness and fatigue has been bothering her for the last several days.  Patient also took an at home pregnancy test which was positive so she wanted to confirm if she was pregnant or not.  She denies any vaginal bleeding, lower abdominal pain.  She does endorse some nasal congestion. Last normal menstrual period was July 18th. She denies any fever or chills.     Shortness of Breath Possible Pregnancy Associated symptoms include shortness of breath.       Prior to Admission medications   Medication Sig Start Date End Date Taking? Authorizing Provider  acetaminophen  (TYLENOL ) 325 MG tablet Take 650 mg by mouth every 6 (six) hours as needed.    [provider]  amoxicillin  (AMOXIL ) 875 MG tablet Take 1 tablet (875 mg total) by mouth 2 (two) times daily. 03/25/21   Cuthriell, Dorn BIRCH, [name]  fluticasone  (FLONASE ) 50 MCG/ACT nasal spray Place 2 sprays into both nostrils daily. 03/08/23   White, Elizabeth A, [name]  ibuprofen  (ADVIL ) 600 MG tablet Take 1 tablet (600 mg total) by mouth every 6 (six) hours as needed. 07/26/22   Enedelia Dorna HERO, FNP  ibuprofen  (ADVIL ) 600 MG tablet Take 1 tablet (600 mg total) by mouth every 6 (six) hours as needed. 10/21/22   Horton, Charmaine FALCON, MD  magic mouthwash w/lidocaine  SOLN Take 5 mLs by mouth 4 (four) times daily. 03/25/21   Cuthriell, Dorn BIRCH, [name]  methocarbamol  (ROBAXIN ) 500 MG tablet Take 1 tablet (500 mg total) by mouth 2 (two) times daily. 07/26/22   Enedelia Dorna HERO, FNP  misoprostol  (CYTOTEC ) 200 MCG tablet Place 1 tablet (200 mcg total) vaginally once for 1 dose. At bedtime evening prior to  procedure 09/20/20 09/20/20  Carlin Rollene HERO, CNM  Prenatal Vit-Fe Fumarate-FA (PRENATAL MULTIVITAMIN) TABS tablet Take 1 tablet by mouth daily at 12 noon.    [provider]  sertraline  (ZOLOFT ) 50 MG tablet Take 2 tablets (100 mg total) by mouth daily. Take half a tablet for the first 5-7 days. 09/20/20   Carlin Rollene HERO, CNM    Allergies: Patient has no known allergies.    Review of Systems  Respiratory:  Positive for shortness of breath.   All other systems reviewed and are negative.   Updated Vital Signs BP 115/78   Pulse 76   Temp 98.5 F (36.9 C)   Resp 19   Ht 5' 4 (1.626 m)   LMP 08/07/2023 (Exact Date)   SpO2 99%   BMI 37.77 kg/m   Physical Exam Vitals and nursing note reviewed.  Constitutional:      General: She is not in acute distress.    Appearance: Normal appearance.  HENT:     Head: Normocephalic and atraumatic.  Eyes:     General:        Right eye: No discharge.        Left eye: No discharge.  Cardiovascular:     Comments: Regular rate and rhythm.  S1/S2 are distinct without any evidence of murmur, rubs, or gallops.  Radial pulses are 2+ bilaterally.  Dorsalis pedis pulses are 2+ bilaterally.  No evidence of pedal edema.  Pulmonary:     Comments: Clear to auscultation bilaterally.  Normal effort.  No respiratory distress.  No evidence of wheezes, rales, or rhonchi heard throughout. Abdominal:     General: Abdomen is flat. Bowel sounds are normal. There is no distension.     Tenderness: There is no abdominal tenderness. There is no guarding or rebound.  Musculoskeletal:        General: Normal range of motion.     Cervical back: Neck supple.  Skin:    General: Skin is warm and dry.     Findings: No rash.  Neurological:     General: No focal deficit present.     Mental Status: She is alert.  Psychiatric:        Mood and Affect: Mood normal.        Behavior: Behavior normal.     (all labs ordered are listed, but only abnormal results are  displayed) Labs Reviewed  CBC WITH DIFFERENTIAL/PLATELET - Abnormal; Notable for the following components:      Result Value   RBC 3.73 (*)    Hemoglobin 11.5 (*)    HCT 34.3 (*)    All other components within normal limits  COMPREHENSIVE METABOLIC PANEL WITH GFR - Abnormal; Notable for the following components:   Glucose, Bld 107 (*)    BUN 5 (*)    Calcium  8.6 (*)    Albumin 3.3 (*)    AST 13 (*)    All other components within normal limits  HCG, SERUM, QUALITATIVE - Abnormal; Notable for the following components:   Preg, Serum POSITIVE (*)    All other components within normal limits  PREGNANCY, URINE - Abnormal; Notable for the following components:   Preg Test, Ur POSITIVE (*)    All other components within normal limits  I-STAT CHEM 8, ED - Abnormal; Notable for the following components:   BUN 4 (*)    Glucose, Bld 104 (*)    Calcium , Ion 1.12 (*)    Hemoglobin 11.9 (*)    HCT 35.0 (*)    All other components within normal limits  RESP PANEL BY RT-PCR (RSV, FLU A&B, COVID)  [id number]  TROPONIN I (HIGH SENSITIVITY)  TROPONIN I (HIGH SENSITIVITY)    EKG: None  Radiology: DG Chest 2 View Result Date: 10/04/2023 EXAM: 2 VIEW(S) XRAY OF THE CHEST 10/04/2023 06:12:00 AM COMPARISON: 08/11/2021 CLINICAL HISTORY: SOB. Reason for exam: SOB FINDINGS: LUNGS AND PLEURA: No focal pulmonary opacity. No pulmonary edema. No pleural effusion. No pneumothorax. HEART AND MEDIASTINUM: No acute abnormality of the cardiac and mediastinal silhouettes. BONES AND SOFT TISSUES: No acute osseous abnormality. IMPRESSION: 1. No acute process. Electronically signed by: Waddell Calk MD 10/04/2023 06:35 AM EDT RP Workstation: [id number]     Procedures   Medications Ordered in the ED - No data to display  Clinical Course as of 10/04/23 1531  Sun Oct 04, 2023  0710 CBC with Differential(!) No evidence of leukocytosis.  [CF]  0710 Comprehensive metabolic panel(!) Negative.  [CF]  0710 hCG, serum,  qualitative(!) Positive. [CF]  0710 Troponin I (High Sensitivity) Initial and delta troponin is normal.  [CF]  0711 Resp panel by RT-PCR (RSV, Flu A&B, Covid) Anterior Nasal Swab Negative.  [CF]  0711 DG Chest 2 View No evidence of pneumonia. I do agree with the radiologist interpretation.  [CF]    Clinical Course User Index [CF] Theotis Cameron HERO, [name]    Medical Decision Making Amount and/or Complexity of Data Reviewed Labs:  Decision-making details documented in ED Course. Radiology:  Decision-making details documented in ED Course.   Christina Riggs is a 33 y.o. female patient who presents to the emergency department today for further evaluation of generalized weakness, fatigue, and nasal congestion.  This could be a viral infection or related to pregnancy.  Patient does have a confirmed pregnancy here approximately 6 to 8 weeks by dates.  Patient has negative troponins x 2.  No evidence of leukocytosis.  No pneumonia on chest x-ray.  No electrolyte abnormalities. Low suspicion for any pregnancy complications. Patient does not have an OB yet as her most recent 1 does not take her Medicaid.  Will plan to give her referral.  Patient comfortable with discharge.  Strict turn precautions were discussed.  She is safe for discharge.     Final diagnoses:  Nasal congestion  Less than [redacted] weeks gestation of pregnancy    ED Discharge Orders     None          Theotis Cameron HERO, [place] 10/04/23 1531    Dasie Faden, MD 10/06/23 [phone]

## 2023-11-04 ENCOUNTER — Telehealth

## 2023-11-04 DIAGNOSIS — Z3480 Encounter for supervision of other normal pregnancy, unspecified trimester: Secondary | ICD-10-CM | POA: Insufficient documentation

## 2023-11-04 DIAGNOSIS — Z3481 Encounter for supervision of other normal pregnancy, first trimester: Secondary | ICD-10-CM

## 2023-11-04 DIAGNOSIS — Z3A12 12 weeks gestation of pregnancy: Secondary | ICD-10-CM

## 2023-11-04 MED ORDER — BLOOD PRESSURE MONITORING DEVI: 1.0000 | 0 refills | Status: AC

## 2023-11-04 NOTE — Progress Notes (Signed)
 New OB Intake  I connected with Jeannette Tramayne Bubel  on 11/04/23 at 10:15 AM EDT by MyChart Video Visit and verified that I am speaking with the correct person using two identifiers. Nurse is located at [facility] and pt is located at home.  I discussed the limitations, risks, security and privacy concerns of performing an evaluation and management service by telephone and the availability of in person appointments. I also discussed with the patient that there may be a patient responsible charge related to this service. The patient expressed understanding and agreed to proceed.  I explained I am completing New OB Intake today. We discussed EDD of 05/13/24 based on LMP of 08/07/23. Pt is [id number]. I reviewed her allergies, medications and Medical/Surgical/OB history.    Patient Active Problem List   Diagnosis Date Noted   Supervision of other normal pregnancy, antepartum 11/04/2023   ASCUS of cervix with negative high risk HPV 09/27/2020   History of depressive symptoms 09/20/2020   Postpartum care following vaginal delivery 05/05/2020     Concerns addressed today  Delivery Plans Plans to deliver at [facility]. Discussed the nature of our practice with multiple providers including residents and students as well as female and female providers. Due to the size of the practice, the delivering provider may not be the same as those providing prenatal care.   Patient is not interested in water birth.  MyChart/Babyscripts MyChart access verified. I explained pt will have some visits in office and some virtually. Babyscripts instructions given and order placed. Patient verifies receipt of registration text/e-mail. Account successfully created and app downloaded. If patient is a candidate for Optimized scheduling, add to sticky note.   Blood Pressure Cuff/Weight Scale Blood pressure cuff ordered for patient to pick-up from [employer]. Explained after first prenatal appt pt will check weekly and  document in Babyscripts. Patient does have weight scale.  Anatomy US  Explained first scheduled US  will be around 19 weeks. Dating US  scheduled for 11/11/23 at 10:15a.  Is patient a CenteringPregnancy candidate?  Accepted       Is patient a Mom+Baby Combined Care candidate?  Not a candidate   If accepted, confirm patient does not intend to move from the area for at least 12 months, then notify Mom+Baby staff  Is patient a candidate for Babyscripts Optimization?    First visit review I reviewed new OB appt with patient. Explained pt will be seen by Camie Regino HOWARD at first visit. Discussed [place] genetic screening with patient. Needs Panorama and Horizon.. Routine prenatal labs needed at [facility] OB visit.   Last Pap Diagnosis  Date Value Ref Range Status  09/20/2020 (A)  Final   - Atypical squamous cells of undetermined significance (ASC-US )    Sharlet GORMAN Mulch, CMA 11/04/2023  10:52 AM

## 2023-11-04 NOTE — Patient Instructions (Signed)

## 2023-11-11 ENCOUNTER — Other Ambulatory Visit

## 2023-11-16 NOTE — Progress Notes (Unsigned)
 History:   Christina Riggs is a 33 y.o. [id number] at [redacted]w[redacted]d by {Ob dating:14516} being seen today for her first obstetrical visit.  Her obstetrical history is significant for {ob risk factors:10154}. Patient {does/does not:19097} intend to breast feed. Pregnancy history fully reviewed.  Patient reports {sx:14538}.      HISTORY: OB History  Gravida Para Term Preterm AB Living  5 4 2 2  0 4  SAB IAB Ectopic Multiple Live Births  0 0 0 0 4    # Outcome Date GA Lbr Len/2nd Weight Sex Type Anes PTL Lv  5 Current           4 Preterm 05/05/21 101w0d    Vag-Spont   LIV  3 Preterm 05/05/20 [redacted]w[redacted]d 08:54 / 00:03 3 lb 15.9 oz (1.81 kg) M Vag-Spont EPI  LIV     Name: Christina Riggs     Apgar1: 6  Apgar5: 8  2 Term 08/25/12 [redacted]w[redacted]d  7 lb 8.6 oz (3.42 kg) M Vag-Spont  N LIV     Birth Comments: at home  1 Term 03/14/10    F Vag-Spont  N LIV     Birth Comments: System Generated. Please review and update pregnancy details.    Last pap smear was done *** and was {Normal/Abnormal Appearance:21344::normal}  Past Medical History:  Diagnosis Date   Fracture of foot    Right foot fracture 10/2019   Hypertension    per pt report   Past Surgical History:  Procedure Laterality Date   NO PAST SURGERIES     Family History  Problem Relation Age of Onset   Hypertension Father    Stroke Father    Social History   Tobacco Use   Smoking status: Former    Types: Cigars   Smokeless tobacco: Never   Tobacco comments:    1  day  Vaping Use   Vaping status: Every Day  Substance Use Topics   Alcohol use: Yes   Drug use: No   No Known Allergies Current Outpatient Medications on File Prior to Visit  Medication Sig Dispense Refill   acetaminophen  (TYLENOL ) 325 MG tablet Take 650 mg by mouth every 6 (six) hours as needed.     amoxicillin  (AMOXIL ) 875 MG tablet Take 1 tablet (875 mg total) by mouth 2 (two) times daily. (Patient not taking: Reported on 11/04/2023) 14 tablet 0   Blood Pressure  Monitoring DEVI 1 each by Does not apply route once a week. 1 each 0   fluticasone  (FLONASE ) 50 MCG/ACT nasal spray Place 2 sprays into both nostrils daily. (Patient not taking: Reported on 11/04/2023) 9.9 mL 2   ibuprofen  (ADVIL ) 600 MG tablet Take 1 tablet (600 mg total) by mouth every 6 (six) hours as needed. (Patient not taking: Reported on 11/04/2023) 30 tablet 0   ibuprofen  (ADVIL ) 600 MG tablet Take 1 tablet (600 mg total) by mouth every 6 (six) hours as needed. (Patient not taking: Reported on 11/04/2023) 30 tablet 0   magic mouthwash w/lidocaine  SOLN Take 5 mLs by mouth 4 (four) times daily. (Patient not taking: Reported on 11/04/2023) 240 mL 0   methocarbamol  (ROBAXIN ) 500 MG tablet Take 1 tablet (500 mg total) by mouth 2 (two) times daily. (Patient not taking: Reported on 11/04/2023) 20 tablet 0   misoprostol  (CYTOTEC ) 200 MCG tablet Place 1 tablet (200 mcg total) vaginally once for 1 dose. At bedtime evening prior to procedure 1 tablet 0   Prenatal Vit-Fe Fumarate-FA (PRENATAL MULTIVITAMIN) TABS  tablet Take 1 tablet by mouth daily at 12 noon.     sertraline  (ZOLOFT ) 50 MG tablet Take 2 tablets (100 mg total) by mouth daily. Take half a tablet for the first 5-7 days. (Patient not taking: Reported on 11/04/2023) 60 tablet 3   No current facility-administered medications on file prior to visit.    Review of Systems Pertinent items noted in HPI and remainder of comprehensive ROS otherwise negative. Physical Exam:  There were no vitals filed for this visit.    Constitutional: Well-developed, well-nourished pregnant female in no acute distress.  HEENT: PERRLA Skin: normal color and turgor, no rash Cardiovascular: normal rate & rhythm, warm and well perfused Respiratory: normal effort, no problems with respiration noted GI: Abd soft, non-distended MS: Extremities nontender, no edema, normal ROM Neurologic: Alert and oriented x 4.  GU: no CVA tenderness Pelvic: NEFG, physiologic  discharge, no blood, cervix clean. ***Pap/swabs collected ***Exam deferred  Assessment:    Pregnancy: [id number] Patient Active Problem List   Diagnosis Date Noted   Supervision of other normal pregnancy, antepartum 11/04/2023   ASCUS of cervix with negative high risk HPV 09/27/2020   History of depressive symptoms 09/20/2020   Postpartum care following vaginal delivery 05/05/2020     Plan:    1. Supervision of other normal pregnancy, antepartum (Primary) ***  2. [redacted] weeks gestation of pregnancy ***   - Initial labs drawn. - Continue prenatal vitamins. - Problem list reviewed and updated. - Genetic Screening discussed, {Blank multiple:19196::First trimester screen,Quad screen,NIPS}: {requests/ordered/declines:14581}. - Ultrasound discussed; fetal anatomic survey: {requests/ordered/declines:14581}. - Anticipatory guidance about prenatal visits given including labs, ultrasounds, and testing. - Discussed usage of Babyscripts and virtual visits as additional source of managing and completing prenatal visits in midst of coronavirus and pandemic.   - Encouraged to complete MyChart Registration for her ability to review results, send requests, and have questions addressed.  - The nature of [place] [facility]ulty Practice with multiple MDs and Advanced Practice Providers was explained to patient; also emphasized that residents, students are part of our team. - Routine obstetric precautions reviewed. Encouraged to seek out care at office or emergency room [facility] MAU preferred) for urgent and/or emergent concerns.  No follow-ups on file.    Future Appointments  Date Time Provider Department Center  11/17/2023  8:55 AM Regino Camie DELENA EDDY [facility]  12/28/2023  9:00 AM CENTERING PROVIDER [facility]  01/25/2024  9:00 AM CENTERING PROVIDER [facility]  02/22/2024  9:00 AM CENTERING PROVIDER [facility]  03/07/2024  9:00 AM CENTERING PROVIDER [facility]   03/21/2024  9:00 AM CENTERING PROVIDER [facility]  04/04/2024  9:00 AM CENTERING PROVIDER [facility]  04/18/2024  9:00 AM CENTERING PROVIDER [facility]  05/02/2024  9:00 AM CENTERING PROVIDER [facility]  05/09/2024  9:00 AM CENTERING PROVIDER [facility]  05/16/2024  9:00 AM CENTERING PROVIDER [facility]    Camie Regino, MSN, CNM, RNC-OB Certified Nurse Midwife, [facility] 11/16/2023 4:06 PM

## 2023-11-17 ENCOUNTER — Ambulatory Visit (INDEPENDENT_AMBULATORY_CARE_PROVIDER_SITE_OTHER): Admitting: Certified Nurse Midwife

## 2023-11-17 ENCOUNTER — Encounter: Payer: Self-pay | Admitting: Certified Nurse Midwife

## 2023-11-17 ENCOUNTER — Other Ambulatory Visit: Payer: Self-pay

## 2023-11-17 ENCOUNTER — Other Ambulatory Visit (HOSPITAL_COMMUNITY)
Admission: RE | Admit: 2023-11-17 | Discharge: 2023-11-17 | Disposition: A | Source: Ambulatory Visit | Attending: Certified Nurse Midwife | Admitting: Certified Nurse Midwife

## 2023-11-17 VITALS — BP 142/89 | HR 84 | Wt 274.5 lb

## 2023-11-17 DIAGNOSIS — Z1332 Encounter for screening for maternal depression: Secondary | ICD-10-CM

## 2023-11-17 DIAGNOSIS — Z124 Encounter for screening for malignant neoplasm of cervix: Secondary | ICD-10-CM

## 2023-11-17 DIAGNOSIS — Z23 Encounter for immunization: Secondary | ICD-10-CM | POA: Diagnosis not present

## 2023-11-17 DIAGNOSIS — Z3480 Encounter for supervision of other normal pregnancy, unspecified trimester: Secondary | ICD-10-CM

## 2023-11-17 DIAGNOSIS — Z3A14 14 weeks gestation of pregnancy: Secondary | ICD-10-CM | POA: Diagnosis not present

## 2023-11-17 DIAGNOSIS — O10912 Unspecified pre-existing hypertension complicating pregnancy, second trimester: Secondary | ICD-10-CM

## 2023-11-17 DIAGNOSIS — O219 Vomiting of pregnancy, unspecified: Secondary | ICD-10-CM | POA: Diagnosis not present

## 2023-11-17 DIAGNOSIS — O10919 Unspecified pre-existing hypertension complicating pregnancy, unspecified trimester: Secondary | ICD-10-CM

## 2023-11-17 MED ORDER — ASPIRIN 81 MG PO TBEC: 81.0000 mg | DELAYED_RELEASE_TABLET | Freq: Every day | ORAL | 3 refills | Status: AC

## 2023-11-17 MED ORDER — NIFEDIPINE ER OSMOTIC RELEASE 30 MG PO TB24: 30.0000 mg | ORAL_TABLET | Freq: Every day | ORAL | 6 refills | Status: AC

## 2023-11-17 MED ORDER — ONDANSETRON 4 MG PO TBDP: 4.0000 mg | ORAL_TABLET | Freq: Three times a day (TID) | ORAL | 2 refills | Status: AC | PRN

## 2023-11-18 ENCOUNTER — Other Ambulatory Visit: Payer: Self-pay | Admitting: Certified Nurse Midwife

## 2023-11-18 ENCOUNTER — Other Ambulatory Visit

## 2023-11-18 DIAGNOSIS — Z3482 Encounter for supervision of other normal pregnancy, second trimester: Secondary | ICD-10-CM | POA: Diagnosis not present

## 2023-11-18 DIAGNOSIS — Z3A16 16 weeks gestation of pregnancy: Secondary | ICD-10-CM

## 2023-11-18 DIAGNOSIS — Z3480 Encounter for supervision of other normal pregnancy, unspecified trimester: Secondary | ICD-10-CM

## 2023-11-18 LAB — PROTEIN / CREATININE RATIO, URINE
Creatinine, Urine: 267.6 mg/dL
Protein, Ur: 17.3 mg/dL
Protein/Creat Ratio: 65 mg/g{creat} (ref 0–200)

## 2023-11-19 LAB — COMPREHENSIVE METABOLIC PANEL WITH GFR
ALT: 14 IU/L (ref 0–32)
AST: 12 IU/L (ref 0–40)
Albumin: 3.9 g/dL (ref 3.9–4.9)
Alkaline Phosphatase: 57 IU/L (ref 41–116)
BUN/Creatinine Ratio: 8 — ABNORMAL LOW (ref 9–23)
BUN: 5 mg/dL — ABNORMAL LOW (ref 6–20)
Bilirubin Total: 0.3 mg/dL (ref 0.0–1.2)
CO2: 22 mmol/L (ref 20–29)
Calcium: 9 mg/dL (ref 8.7–10.2)
Chloride: 99 mmol/L (ref 96–106)
Creatinine, Ser: 0.66 mg/dL (ref 0.57–1.00)
Globulin, Total: 2.9 g/dL (ref 1.5–4.5)
Glucose: 94 mg/dL (ref 70–99)
Potassium: 3.7 mmol/L (ref 3.5–5.2)
Sodium: 135 mmol/L (ref 134–144)
Total Protein: 6.8 g/dL (ref 6.0–8.5)
eGFR: 118 mL/min/1.73 (ref >59)

## 2023-11-19 LAB — CULTURE, OB URINE

## 2023-11-19 LAB — CBC/D/PLT+RPR+RH+ABO+RUBIGG...
Basophils Absolute: 0 x10E3/uL (ref 0.0–0.2)
Basos: 1 %
EOS (ABSOLUTE): 0 x10E3/uL (ref 0.0–0.4)
Eos: 1 %
HCV Ab: NONREACTIVE
HIV Screen 4th Generation wRfx: NONREACTIVE
Hematocrit: 37.4 % (ref 34.0–46.6)
Hemoglobin: 12.3 g/dL (ref 11.1–15.9)
Hepatitis B Surface Ag: NEGATIVE
Immature Grans (Abs): 0 x10E3/uL (ref 0.0–0.1)
Immature Granulocytes: 0 %
Lymphocytes Absolute: 1.6 x10E3/uL (ref 0.7–3.1)
Lymphs: 37 %
MCH: 30.2 pg (ref 26.6–33.0)
MCHC: 32.9 g/dL (ref 31.5–35.7)
MCV: 92 fL (ref 79–97)
Monocytes Absolute: 0.2 x10E3/uL (ref 0.1–0.9)
Monocytes: 5 %
Neutrophils Absolute: 2.5 x10E3/uL (ref 1.4–7.0)
Neutrophils: 56 %
Platelets: 367 x10E3/uL (ref 150–450)
RBC: 4.07 x10E6/uL (ref 3.77–5.28)
RDW: 12.7 % (ref 11.7–15.4)
RPR Ser Ql: NONREACTIVE
Rh Factor: POSITIVE
Rubella Antibodies, IGG: 1.58 {index} (ref >0.99)
WBC: 4.3 x10E3/uL (ref 3.4–10.8)

## 2023-11-19 LAB — TSH: TSH: 0.874 u[IU]/mL (ref 0.450–4.500)

## 2023-11-19 LAB — HEMOGLOBIN A1C
Est. average glucose Bld gHb Est-mCnc: 105 mg/dL
Hgb A1c MFr Bld: 5.3 % (ref 4.8–5.6)

## 2023-11-19 LAB — URINE CULTURE, OB REFLEX

## 2023-11-19 LAB — AB SCR+ANTIBODY ID: Antibody Screen: POSITIVE — AB

## 2023-11-19 LAB — HCV INTERPRETATION

## 2023-11-20 LAB — CYTOLOGY - PAP
Chlamydia: POSITIVE — AB
Comment: NEGATIVE
Comment: NEGATIVE
Comment: NORMAL
Diagnosis: NEGATIVE
High risk HPV: NEGATIVE
Neisseria Gonorrhea: NEGATIVE

## 2023-11-25 ENCOUNTER — Ambulatory Visit: Payer: Self-pay | Admitting: Certified Nurse Midwife

## 2023-11-25 DIAGNOSIS — A749 Chlamydial infection, unspecified: Secondary | ICD-10-CM

## 2023-11-25 MED ORDER — AZITHROMYCIN 500 MG PO TABS: 1000.0000 mg | ORAL_TABLET | Freq: Once | ORAL | 0 refills | Status: AC

## 2023-11-26 ENCOUNTER — Telehealth: Payer: Self-pay

## 2023-11-26 NOTE — Telephone Encounter (Signed)
-----   Message from Camie DELENA Rote sent at 11/25/2023  5:07 PM EST ----- Patient with positive chlamydia result on initial PN labs. Please notify patient of results via phone and partner treatment at [facility].   1. Chlamydia infection affecting pregnancy in second trimester (Primary) - azithromycin  (ZITHROMAX ) 500 MG tablet; Take 2 tablets (1,000 mg total) by mouth once for 1 dose.  Dispense: 2 tablet; Refill: 0  Camie Rote, MSN, CNM, RNC-OB Certified Nurse Midwife, [facility] 11/25/2023 5:07 PM  ----- Message ----- From: Rebecka Memos Lab Results In Sent: 11/18/2023   2:36 PM EST To: Camie DELENA Rote, CNM

## 2023-11-26 NOTE — Telephone Encounter (Signed)
 Called Pt to go over + Chlamydia test results & that Rx was sent to pharmacy and [facility]. No answer, left VM.

## 2023-11-26 NOTE — Telephone Encounter (Signed)
 Spoke w/ Patient to go over  + Chlamydia results & that Tx was sent to Rx on file & for Partner to get tx also, no sex until 2 weeks after the last person has been treated. Pt verbalized understanding.

## 2023-11-27 LAB — PANORAMA PRENATAL TEST FULL PANEL:PANORAMA TEST PLUS 5 ADDITIONAL MICRODELETIONS: FETAL FRACTION: 3.2

## 2023-11-27 LAB — HORIZON CUSTOM: REPORT SUMMARY: NEGATIVE

## 2023-11-29 ENCOUNTER — Observation Stay (HOSPITAL_COMMUNITY)

## 2023-11-29 ENCOUNTER — Encounter (HOSPITAL_COMMUNITY): Payer: Self-pay | Admitting: Obstetrics and Gynecology

## 2023-11-29 ENCOUNTER — Other Ambulatory Visit: Payer: Self-pay

## 2023-11-29 ENCOUNTER — Inpatient Hospital Stay (HOSPITAL_COMMUNITY)
Admission: AD | Admit: 2023-11-29 | Discharge: 2023-12-03 | DRG: 831 | Disposition: A | Discharge status: Home / Self Care | Attending: Obstetrics & Gynecology | Admitting: Obstetrics & Gynecology

## 2023-11-29 DIAGNOSIS — R079 Chest pain, unspecified: Secondary | ICD-10-CM | POA: Diagnosis present

## 2023-11-29 DIAGNOSIS — R9431 Abnormal electrocardiogram [ECG] [EKG]: Secondary | ICD-10-CM | POA: Diagnosis present

## 2023-11-29 DIAGNOSIS — Z5941 Food insecurity: Secondary | ICD-10-CM

## 2023-11-29 DIAGNOSIS — Z87891 Personal history of nicotine dependence: Secondary | ICD-10-CM

## 2023-11-29 DIAGNOSIS — K859 Acute pancreatitis without necrosis or infection, unspecified: Principal | ICD-10-CM | POA: Diagnosis present

## 2023-11-29 DIAGNOSIS — I10 Essential (primary) hypertension: Secondary | ICD-10-CM | POA: Diagnosis present

## 2023-11-29 DIAGNOSIS — O10919 Unspecified pre-existing hypertension complicating pregnancy, unspecified trimester: Secondary | ICD-10-CM | POA: Diagnosis present

## 2023-11-29 DIAGNOSIS — Z3A17 17 weeks gestation of pregnancy: Secondary | ICD-10-CM

## 2023-11-29 DIAGNOSIS — O99612 Diseases of the digestive system complicating pregnancy, second trimester: Principal | ICD-10-CM | POA: Diagnosis present

## 2023-11-29 DIAGNOSIS — K5900 Constipation, unspecified: Secondary | ICD-10-CM | POA: Diagnosis present

## 2023-11-29 DIAGNOSIS — Z5982 Transportation insecurity: Secondary | ICD-10-CM

## 2023-11-29 DIAGNOSIS — N1330 Unspecified hydronephrosis: Secondary | ICD-10-CM | POA: Diagnosis present

## 2023-11-29 DIAGNOSIS — Z3A16 16 weeks gestation of pregnancy: Secondary | ICD-10-CM

## 2023-11-29 DIAGNOSIS — O99891 Other specified diseases and conditions complicating pregnancy: Secondary | ICD-10-CM | POA: Diagnosis present

## 2023-11-29 DIAGNOSIS — O10912 Unspecified pre-existing hypertension complicating pregnancy, second trimester: Secondary | ICD-10-CM | POA: Diagnosis present

## 2023-11-29 DIAGNOSIS — Z7982 Long term (current) use of aspirin: Secondary | ICD-10-CM

## 2023-11-29 DIAGNOSIS — Z8249 Family history of ischemic heart disease and other diseases of the circulatory system: Secondary | ICD-10-CM

## 2023-11-29 DIAGNOSIS — Z6841 Body Mass Index (BMI) 40.0 and over, adult: Secondary | ICD-10-CM

## 2023-11-29 HISTORY — DX: Acute pancreatitis without necrosis or infection, unspecified: K85.90

## 2023-11-29 LAB — URINALYSIS, ROUTINE W REFLEX MICROSCOPIC
Bilirubin Urine: NEGATIVE
Glucose, UA: NEGATIVE mg/dL
Hgb urine dipstick: NEGATIVE
Ketones, ur: 80 mg/dL — AB
Nitrite: NEGATIVE
Protein, ur: NEGATIVE mg/dL
Specific Gravity, Urine: 1.021 (ref 1.005–1.030)
pH: 7 (ref 5.0–8.0)

## 2023-11-29 LAB — COMPREHENSIVE METABOLIC PANEL WITH GFR
ALT: 19 U/L (ref 0–44)
AST: 16 U/L (ref 15–41)
Albumin: 3.1 g/dL — ABNORMAL LOW (ref 3.5–5.0)
Alkaline Phosphatase: 54 U/L (ref 38–126)
Anion gap: 10 (ref 5–15)
BUN: 5 mg/dL — ABNORMAL LOW (ref 6–20)
CO2: 22 mmol/L (ref 22–32)
Calcium: 8.3 mg/dL — ABNORMAL LOW (ref 8.9–10.3)
Chloride: 102 mmol/L (ref 98–111)
Creatinine, Ser: 0.63 mg/dL (ref 0.44–1.00)
GFR, Estimated: >60 mL/min (ref >60)
Glucose, Bld: 90 mg/dL (ref 70–99)
Potassium: 3.4 mmol/L — ABNORMAL LOW (ref 3.5–5.1)
Sodium: 134 mmol/L — ABNORMAL LOW (ref 135–145)
Total Bilirubin: 0.5 mg/dL (ref 0.0–1.2)
Total Protein: 6.6 g/dL (ref 6.5–8.1)

## 2023-11-29 LAB — CBC
HCT: 33.6 % — ABNORMAL LOW (ref 36.0–46.0)
Hemoglobin: 11.4 g/dL — ABNORMAL LOW (ref 12.0–15.0)
MCH: 30.5 pg (ref 26.0–34.0)
MCHC: 33.9 g/dL (ref 30.0–36.0)
MCV: 89.8 fL (ref 80.0–100.0)
Platelets: 346 K/uL (ref 150–400)
RBC: 3.74 MIL/uL — ABNORMAL LOW (ref 3.87–5.11)
RDW: 12.9 % (ref 11.5–15.5)
WBC: 6.7 K/uL (ref 4.0–10.5)
nRBC: 0 % (ref 0.0–0.2)

## 2023-11-29 LAB — TRIGLYCERIDES: Triglycerides: 57 mg/dL (ref <150)

## 2023-11-29 LAB — TROPONIN I (HIGH SENSITIVITY): Troponin I (High Sensitivity): 3 ng/L (ref <18)

## 2023-11-29 LAB — LIPASE, BLOOD: Lipase: 111 U/L — ABNORMAL HIGH (ref 11–51)

## 2023-11-29 MED ORDER — LACTATED RINGERS IV BOLUS
1000.0000 mL | Freq: Once | INTRAVENOUS | Status: AC
  Administered 2023-11-29: 1000 mL via INTRAVENOUS

## 2023-11-29 MED ORDER — HYDROMORPHONE HCL 1 MG/ML IJ SOLN
1.0000 mg | INTRAMUSCULAR | Status: DC | PRN
  Administered 2023-11-29 – 2023-12-02 (×12): 1 mg via INTRAVENOUS
  Filled 2023-11-29 (×13): qty 1

## 2023-11-29 MED ORDER — HYDROMORPHONE HCL 1 MG/ML IJ SOLN: 0.5000 mg | INTRAMUSCULAR | Status: DC | PRN

## 2023-11-29 MED ORDER — LACTATED RINGERS IV SOLN: INTRAVENOUS | Status: DC

## 2023-11-29 MED ORDER — POTASSIUM CHLORIDE 10 MEQ/100ML IV SOLN
10.0000 meq | INTRAVENOUS | Status: AC
  Administered 2023-11-29 (×3): 10 meq via INTRAVENOUS
  Filled 2023-11-29 (×7): qty 100

## 2023-11-29 MED ORDER — CALCIUM CARBONATE ANTACID 500 MG PO CHEW
2.0000 | CHEWABLE_TABLET | ORAL | Status: DC | PRN
  Administered 2023-12-02: 400 mg via ORAL
  Filled 2023-11-29: qty 2

## 2023-11-29 MED ORDER — NIFEDIPINE ER OSMOTIC RELEASE 30 MG PO TB24
30.0000 mg | ORAL_TABLET | Freq: Every day | ORAL | Status: DC
  Administered 2023-11-30 – 2023-12-03 (×4): 30 mg via ORAL
  Filled 2023-11-29 (×4): qty 1

## 2023-11-29 MED ORDER — METOCLOPRAMIDE HCL 5 MG/ML IJ SOLN
10.0000 mg | Freq: Four times a day (QID) | INTRAMUSCULAR | Status: DC | PRN
Start: 2023-11-29 — End: 2023-12-03
  Administered 2023-12-01 – 2023-12-02 (×3): 10 mg via INTRAVENOUS
  Filled 2023-11-29 (×3): qty 2

## 2023-11-29 MED ORDER — PRENATAL MULTIVITAMIN CH
1.0000 | ORAL_TABLET | Freq: Every day | ORAL | Status: DC
  Administered 2023-11-30 – 2023-12-02 (×3): 1 via ORAL
  Filled 2023-11-29 (×3): qty 1

## 2023-11-29 MED ORDER — ONDANSETRON HCL 4 MG/2ML IJ SOLN
4.0000 mg | Freq: Four times a day (QID) | INTRAMUSCULAR | Status: DC | PRN
Start: 2023-11-29 — End: 2023-12-03
  Administered 2023-12-02: 4 mg via INTRAVENOUS
  Filled 2023-11-29 (×2): qty 2

## 2023-11-29 MED ORDER — MORPHINE SULFATE (PF) 4 MG/ML IV SOLN
2.0000 mg | Freq: Once | INTRAVENOUS | Status: AC
  Administered 2023-11-29: 2 mg via INTRAVENOUS
  Filled 2023-11-29: qty 1

## 2023-11-29 MED ORDER — ACETAMINOPHEN 10 MG/ML IV SOLN
1000.0000 mg | Freq: Four times a day (QID) | INTRAVENOUS | Status: DC
  Filled 2023-11-29 (×3): qty 100

## 2023-11-29 MED ORDER — ZOLPIDEM TARTRATE 5 MG PO TABS: 5.0000 mg | ORAL_TABLET | Freq: Every evening | ORAL | Status: DC | PRN

## 2023-11-29 MED ORDER — LACTATED RINGERS IV SOLN: 125.0000 mL/h | INTRAVENOUS | Status: DC

## 2023-11-29 MED ORDER — ACETAMINOPHEN 10 MG/ML IV SOLN
1000.0000 mg | Freq: Four times a day (QID) | INTRAVENOUS | Status: DC | PRN
  Administered 2023-11-29 – 2023-11-30 (×2): 1000 mg via INTRAVENOUS
  Filled 2023-11-29: qty 100

## 2023-11-29 MED ORDER — SODIUM CHLORIDE 0.9 % IV SOLN: INTRAVENOUS | Status: DC

## 2023-11-29 MED ORDER — ONDANSETRON 4 MG PO TBDP
4.0000 mg | ORAL_TABLET | Freq: Once | ORAL | Status: AC
  Administered 2023-11-29: 4 mg via ORAL
  Filled 2023-11-29: qty 1

## 2023-11-29 NOTE — MAU Note (Signed)
 Christina Riggs is a 33 y.o. at [redacted]w[redacted]d here in MAU reporting: chest pain and blurred vision that started this am. 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 Feeling cramping in upper abdomen that comes and goes. Denies any congestion, coughing or sore throat. Denies vaginal bleeding or LOF. Reports nausea but no vomiting. GHTN, took procardia this am, did not check BP before taking.   Onset of complaint: today  Pain score: chest pain 10  abdomen  Vitals:   11/29/23 1151  BP: (!) 142/74  Pulse: (!) 103  Resp: 16  Temp: 98.7 F (37.1 C)  SpO2: 97%     FHT:144 Lab orders placed from triage:  EKG

## 2023-11-29 NOTE — H&P (Signed)
 Subjective: [name] is a 33 y.o. [phone] pregnant female at [redacted]w[redacted]d who presents to MAU today with complaint of chest and abdominal pain.    She was in her usual state of health today until 8 to 9 AM this morning.  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  She sat down to rest and suddenly had onset of chest and stomach pain which she describes as a pressure.  The chest pain has been a constant pressure since that time.  Nothing improves the pain it is worse with deep inspiration.   She currently has abdominal pain that is worse in the epigastric and left upper quadrant region which radiates to her back.  She has associated nausea but no vomiting since presenting to the ER.  The pain is 10 out of 10 cramping quality.   No fevers or chills, pain level was 10 out of 10.  No history of abdominal surgery.  She denies past medical history.  No family history of heart conditions, gallbladder disease, or nephrolithiasis.   She denies heartburn, fevers, chills, dysuria, blood in urine. + Constipation.   Receives care at [facility]. Prenatal records reviewed.   Pertinent items noted in HPI and remainder of comprehensive ROS otherwise negative.    Objective: BP 131/63   Pulse 94   Temp 98.7 F (37.1 C) (Oral)   Resp 16   LMP 08/07/2023 (Exact Date)   SpO2 99%  Physical Exam Constitutional:      General: She is in acute distress.     Appearance: She is obese.     Comments: Sitting up in room leaning over a trash can.  Tearful during the exam.  HENT:     Head: Normocephalic and atraumatic.  Cardiovascular:     Rate and Rhythm: Normal rate and regular rhythm.  Pulmonary:     Effort: Pulmonary effort is normal.     Breath sounds: Normal breath sounds.  Chest:     Chest wall: Tenderness present.     Comments: Reproducible sternal tenderness to palpation Abdominal:     General: Bowel sounds are normal.     Palpations: Abdomen is soft.     Tenderness: There  is abdominal tenderness. There is no guarding or rebound.     Comments: No CVA tenderness to palpation bilaterally.  Tenderness to palpation diffusely, but worse in the epigastric region.  Musculoskeletal:     Right lower leg: No edema.     Left lower leg: No edema.  Skin:    General: Skin is warm and dry.  Neurological:     General: No focal deficit present.     Mental Status: She is alert.   EKG normal sinus rhythm: Rate of 94 bpm.  Normal axis.  No ST elevation or depression.  T wave inversion in V2.  Mildly prolonged QTc of 485 ms otherwise unremarkable EKG.    MDM: High risk   MAU Course: Time: 1330 Patient EKG was reviewed.  No STEMI Evaluated patient.  Labs pending.  Zofran , morphine, LR bolus ordered.   Reassessment Time: 1700 Reviewed pt results. Lipase significantly elevated 111 U/L. RUQ US  ordered. Pt continues to report 10 out of 10 pain level in the epigastric region. Dilaudid 1mg , 1000 mg IV acetaminophen  and continuous fluids ordered. Updated pt that workup is concerning for pancreatitis.    Patient denies prior history or family hx of pancreatitis.  Time: 1745 Patient made n.p.o. Discussed patient with Dr.  Cleatus regarding disposition. Right upper quadrant ultrasound pending. Dr. Cleatus graciously agreed to admit for treatment of pancreatitis and second trimester pregnancy.  Will consult appropriate services once right upper quadrant ultrasound is resulted.  Assessment/Plan: #Acute pancreatitis of uncertain etiology - N.p.o. - IV fluids - IV pain medication - Triglycerides - Follow-up right upper quadrant ultrasound results and consider consult to surgery if indicated  Leeroy KATHEE Pouch, MD FM-OB Fellow [facility]

## 2023-11-29 NOTE — MAU Note (Cosign Needed Addendum)
 Maternal Assessment Unit Provider Note  Subjective: [name] is a 33 y.o. [phone] pregnant female at [redacted]w[redacted]d who presents to MAU today with complaint of chest and abdominal pain.   She was in her usual state of health today until 8 to 9 AM this morning.  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  She sat down to rest and suddenly had onset of chest and stomach pain which she describes as a pressure.  The chest pain has been a constant pressure since that time.  Nothing improves the pain it is worse with deep inspiration.  She currently has abdominal pain that is worse in the epigastric and left upper quadrant region which radiates to her back.  She has associated nausea but no vomiting since presenting to the ER.  The pain is 10 out of 10 cramping quality.  No fevers or chills, pain level was 10 out of 10.  No history of abdominal surgery.  She denies past medical history.  No family history of heart conditions, gallbladder disease, or nephrolithiasis.  She denies heartburn, fevers, chills, dysuria, blood in urine. + Constipation.  Receives care at [facility]. Prenatal records reviewed.  Pertinent items noted in HPI and remainder of comprehensive ROS otherwise negative.   Objective: BP 131/63   Pulse 94   Temp 98.7 F (37.1 C) (Oral)   Resp 16   LMP 08/07/2023 (Exact Date)   SpO2 99%  Physical Exam Constitutional:      General: She is in acute distress.     Appearance: She is obese.     Comments: Sitting up in room leaning over a trash can.  Tearful during the exam.  HENT:     Head: Normocephalic and atraumatic.  Cardiovascular:     Rate and Rhythm: Normal rate and regular rhythm.  Pulmonary:     Effort: Pulmonary effort is normal.     Breath sounds: Normal breath sounds.  Chest:     Chest wall: Tenderness present.     Comments: Reproducible sternal tenderness to palpation Abdominal:     General: Bowel sounds are normal.     Palpations: Abdomen  is soft.     Tenderness: There is abdominal tenderness. There is no guarding or rebound.     Comments: No CVA tenderness to palpation bilaterally.  Tenderness to palpation diffusely, but worse in the epigastric region.  Musculoskeletal:     Right lower leg: No edema.     Left lower leg: No edema.  Skin:    General: Skin is warm and dry.  Neurological:     General: No focal deficit present.     Mental Status: She is alert.    EKG normal sinus rhythm: Rate of 94 bpm.  Normal axis.  No ST elevation or depression.  T wave inversion in V2.  Mildly prolonged QTc of 485 ms otherwise unremarkable EKG.    MDM: High risk  MAU Course: Time: 1330 Patient EKG was reviewed.  No STEMI Evaluated patient.  Labs pending.  Zofran , morphine, LR bolus ordered.  Reassessment Time: 1700 Reviewed pt results. Lipase significantly elevated 111 U/L. RUQ US  ordered. Pt continues to report 10 out of 10 pain level in the epigastric region. Dilaudid 0.5mg  and continuous fluids ordered. Updated pt that workup is concerning for pancreatitis.   Patient denies prior history or family hx of pancreatitis.  See H&P for additional details.  Assessment Medical screening exam complete    ICD-10-CM  1. Acute pancreatitis, unspecified complication status, unspecified pancreatitis type  K85.90       Plan Dr. Cleatus graciously agreed to admit this patient for acute pancreatitis of unknown etiology. RUQ US  is still pending.  No Known Allergies  No current facility-administered medications on file prior to encounter.   Current Outpatient Medications on File Prior to Encounter  Medication Sig Dispense Refill   acetaminophen  (TYLENOL ) 325 MG tablet Take 650 mg by mouth every 6 (six) hours as needed.     aspirin EC 81 MG tablet Take 1 tablet (81 mg total) by mouth daily. 90 tablet 3   NIFEdipine (PROCARDIA XL) 30 MG 24 hr tablet Take 1 tablet (30 mg total) by mouth daily. 30 tablet 6   Prenatal Vit-Fe  Fumarate-FA (PRENATAL MULTIVITAMIN) TABS tablet Take 1 tablet by mouth daily at 12 noon.     amoxicillin  (AMOXIL ) 875 MG tablet Take 1 tablet (875 mg total) by mouth 2 (two) times daily. (Patient not taking: Reported on 11/04/2023) 14 tablet 0   Blood Pressure Monitoring DEVI 1 each by Does not apply route once a week. 1 each 0   fluticasone  (FLONASE ) 50 MCG/ACT nasal spray Place 2 sprays into both nostrils daily. (Patient not taking: Reported on 11/04/2023) 9.9 mL 2   ibuprofen  (ADVIL ) 600 MG tablet Take 1 tablet (600 mg total) by mouth every 6 (six) hours as needed. (Patient not taking: Reported on 11/04/2023) 30 tablet 0   ibuprofen  (ADVIL ) 600 MG tablet Take 1 tablet (600 mg total) by mouth every 6 (six) hours as needed. (Patient not taking: Reported on 11/04/2023) 30 tablet 0   magic mouthwash w/lidocaine  SOLN Take 5 mLs by mouth 4 (four) times daily. (Patient not taking: Reported on 11/04/2023) 240 mL 0   methocarbamol  (ROBAXIN ) 500 MG tablet Take 1 tablet (500 mg total) by mouth 2 (two) times daily. (Patient not taking: Reported on 11/04/2023) 20 tablet 0   misoprostol  (CYTOTEC ) 200 MCG tablet Place 1 tablet (200 mcg total) vaginally once for 1 dose. At bedtime evening prior to procedure 1 tablet 0   ondansetron  (ZOFRAN -ODT) 4 MG disintegrating tablet Take 1 tablet (4 mg total) by mouth every 8 (eight) hours as needed for nausea. 42 tablet 2   sertraline  (ZOLOFT ) 50 MG tablet Take 2 tablets (100 mg total) by mouth daily. Take half a tablet for the first 5-7 days. (Patient not taking: Reported on 11/04/2023) 60 tablet 3     Trudy Leeroy NOVAK, MD 11/29/2023 5:59 PM

## 2023-11-30 ENCOUNTER — Inpatient Hospital Stay (HOSPITAL_COMMUNITY)

## 2023-11-30 ENCOUNTER — Observation Stay (HOSPITAL_COMMUNITY)

## 2023-11-30 DIAGNOSIS — O99612 Diseases of the digestive system complicating pregnancy, second trimester: Secondary | ICD-10-CM | POA: Diagnosis present

## 2023-11-30 DIAGNOSIS — Z8249 Family history of ischemic heart disease and other diseases of the circulatory system: Secondary | ICD-10-CM | POA: Diagnosis not present

## 2023-11-30 DIAGNOSIS — R109 Unspecified abdominal pain: Secondary | ICD-10-CM | POA: Diagnosis present

## 2023-11-30 DIAGNOSIS — Z6841 Body Mass Index (BMI) 40.0 and over, adult: Secondary | ICD-10-CM

## 2023-11-30 DIAGNOSIS — N1330 Unspecified hydronephrosis: Secondary | ICD-10-CM | POA: Diagnosis present

## 2023-11-30 DIAGNOSIS — Z7982 Long term (current) use of aspirin: Secondary | ICD-10-CM | POA: Diagnosis not present

## 2023-11-30 DIAGNOSIS — R9431 Abnormal electrocardiogram [ECG] [EKG]: Secondary | ICD-10-CM | POA: Diagnosis present

## 2023-11-30 DIAGNOSIS — Z87891 Personal history of nicotine dependence: Secondary | ICD-10-CM | POA: Diagnosis not present

## 2023-11-30 DIAGNOSIS — O10912 Unspecified pre-existing hypertension complicating pregnancy, second trimester: Secondary | ICD-10-CM | POA: Diagnosis present

## 2023-11-30 DIAGNOSIS — Z3A16 16 weeks gestation of pregnancy: Secondary | ICD-10-CM | POA: Diagnosis not present

## 2023-11-30 DIAGNOSIS — Z5941 Food insecurity: Secondary | ICD-10-CM | POA: Diagnosis not present

## 2023-11-30 DIAGNOSIS — Z3A17 17 weeks gestation of pregnancy: Secondary | ICD-10-CM | POA: Diagnosis not present

## 2023-11-30 DIAGNOSIS — K5900 Constipation, unspecified: Secondary | ICD-10-CM | POA: Diagnosis present

## 2023-11-30 DIAGNOSIS — R079 Chest pain, unspecified: Secondary | ICD-10-CM | POA: Diagnosis present

## 2023-11-30 DIAGNOSIS — O99891 Other specified diseases and conditions complicating pregnancy: Secondary | ICD-10-CM | POA: Diagnosis present

## 2023-11-30 DIAGNOSIS — Z5982 Transportation insecurity: Secondary | ICD-10-CM | POA: Diagnosis not present

## 2023-11-30 DIAGNOSIS — K859 Acute pancreatitis without necrosis or infection, unspecified: Secondary | ICD-10-CM | POA: Diagnosis present

## 2023-11-30 LAB — COMPREHENSIVE METABOLIC PANEL WITH GFR
ALT: 16 U/L (ref 0–44)
AST: 14 U/L — ABNORMAL LOW (ref 15–41)
Albumin: 2.7 g/dL — ABNORMAL LOW (ref 3.5–5.0)
Alkaline Phosphatase: 50 U/L (ref 38–126)
Anion gap: 11 (ref 5–15)
BUN: <5 mg/dL — ABNORMAL LOW (ref 6–20)
CO2: 22 mmol/L (ref 22–32)
Calcium: 8.4 mg/dL — ABNORMAL LOW (ref 8.9–10.3)
Chloride: 101 mmol/L (ref 98–111)
Creatinine, Ser: 0.6 mg/dL (ref 0.44–1.00)
GFR, Estimated: >60 mL/min (ref >60)
Glucose, Bld: 79 mg/dL (ref 70–99)
Potassium: 3.4 mmol/L — ABNORMAL LOW (ref 3.5–5.1)
Sodium: 134 mmol/L — ABNORMAL LOW (ref 135–145)
Total Bilirubin: 0.6 mg/dL (ref 0.0–1.2)
Total Protein: 6.4 g/dL — ABNORMAL LOW (ref 6.5–8.1)

## 2023-11-30 LAB — CBC
HCT: 34.2 % — ABNORMAL LOW (ref 36.0–46.0)
Hemoglobin: 11.6 g/dL — ABNORMAL LOW (ref 12.0–15.0)
MCH: 30.4 pg (ref 26.0–34.0)
MCHC: 33.9 g/dL (ref 30.0–36.0)
MCV: 89.5 fL (ref 80.0–100.0)
Platelets: 330 K/uL (ref 150–400)
RBC: 3.82 MIL/uL — ABNORMAL LOW (ref 3.87–5.11)
RDW: 13.1 % (ref 11.5–15.5)
WBC: 8.3 K/uL (ref 4.0–10.5)
nRBC: 0 % (ref 0.0–0.2)

## 2023-11-30 LAB — TROPONIN I (HIGH SENSITIVITY): Troponin I (High Sensitivity): 3 ng/L (ref <18)

## 2023-11-30 LAB — BRAIN NATRIURETIC PEPTIDE: B Natriuretic Peptide: 73.6 pg/mL (ref 0.0–100.0)

## 2023-11-30 LAB — LIPASE, BLOOD: Lipase: 47 U/L (ref 11–51)

## 2023-11-30 MED ORDER — ACETAMINOPHEN 500 MG PO TABS
1000.0000 mg | ORAL_TABLET | Freq: Four times a day (QID) | ORAL | Status: DC | PRN
  Administered 2023-11-30 – 2023-12-02 (×4): 1000 mg via ORAL
  Filled 2023-11-30 (×4): qty 2

## 2023-11-30 MED ORDER — POTASSIUM CHLORIDE 10 MEQ/100ML IV SOLN
INTRAVENOUS | Status: AC
  Administered 2023-11-30: 10 meq
  Filled 2023-11-30: qty 100

## 2023-11-30 MED ORDER — PANTOPRAZOLE SODIUM 40 MG IV SOLR
40.0000 mg | INTRAVENOUS | Status: DC
  Administered 2023-11-30 – 2023-12-02 (×3): 40 mg via INTRAVENOUS
  Filled 2023-11-30 (×3): qty 10

## 2023-11-30 MED ORDER — LACTATED RINGERS IV SOLN: INTRAVENOUS | Status: DC

## 2023-11-30 MED ORDER — POTASSIUM CHLORIDE CRYS ER 20 MEQ PO TBCR
20.0000 meq | EXTENDED_RELEASE_TABLET | Freq: Two times a day (BID) | ORAL | Status: AC
  Administered 2023-11-30 – 2023-12-01 (×3): 20 meq via ORAL
  Filled 2023-11-30 (×3): qty 1

## 2023-11-30 MED ORDER — LORAZEPAM 2 MG/ML IJ SOLN
1.0000 mg | Freq: Once | INTRAMUSCULAR | Status: AC
  Administered 2023-11-30: 1 mg via INTRAVENOUS
  Filled 2023-11-30: qty 1

## 2023-11-30 MED ORDER — ENOXAPARIN SODIUM 60 MG/0.6ML IJ SOSY
60.0000 mg | PREFILLED_SYRINGE | INTRAMUSCULAR | Status: DC
  Administered 2023-11-30 – 2023-12-02 (×3): 60 mg via SUBCUTANEOUS
  Filled 2023-11-30 (×3): qty 0.6

## 2023-11-30 NOTE — Progress Notes (Addendum)
 Daily Antepartum Note  Admission Date: 11/29/2023 Current Date: 11/30/2023 9:21 AM  Christina Riggs is a 33 y.o. [phone] at [redacted]w[redacted]d, admitted for abdominal pain and elevated lipase  Pregnancy complicated by: Patient Active Problem List   Diagnosis Date Noted   BMI 45.0-49.9, adult (HCC) 11/30/2023   Chronic hypertension 11/29/2023   Pancreatitis 11/29/2023   Supervision of other normal pregnancy, antepartum 11/04/2023   ASCUS of cervix with negative high risk HPV 09/27/2020   History of depressive symptoms 09/20/2020   Overnight/24hr events:  See below  Subjective:  Patient states medicine she got recently helped and made the s/s go away but they are coming back. Patient states that she took the medicine that was sent in by the clinic for her chlamydia (PO azithro) and then an hour later at work her s/s started and except for above have persisted (not any better or worse). She also states she took he procardia xl, which was also sent in and zofran  around that time, too. No sob, cough, nausea or vomiting. Pt states she would like to eat. She states she had some b/l LE numbness that went away after she got out of bed and started moving around   Objective:    Current Vital Signs 24h Vital Sign Ranges  T 98.1 F (36.7 C) Temp  Avg: 98.3 F (36.8 C)  Min: 98.1 F (36.7 C)  Max: 98.7 F (37.1 C)  BP 136/64 BP  Min: 120/67  Max: 143/81  HR 97 Pulse  Avg: 94.8  Min: 90  Max: 103  RR 17 Resp  Avg: 17.5  Min: 16  Max: 19  SaO2 96 % Room Air SpO2  Avg: 97.4 %  Min: 96 %  Max: 100 %       24 Hour I/O Current Shift I/O  Time Ins Outs 11/09 0701 - 11/10 0700 In: 2408.3 [I.V.:2008.3] Out: 1350 [Urine:1350] 11/10 0701 - 11/10 1900 In: -  Out: 400 [Urine:400]   Patient Vitals for the past 24 hrs:  BP Temp Temp src Pulse Resp SpO2 Height Weight  11/30/23 0729 136/64 98.1 F (36.7 C) Oral 97 17 96 % -- --  11/30/23 0511 (!) 121/59 98.2 F (36.8 C) Oral 94 18 96 % -- --  11/29/23  2332 120/67 98.3 F (36.8 C) Oral 94 17 97 % -- --  11/29/23 1936 136/79 98.2 F (36.8 C) Oral 91 18 98 % -- --  11/29/23 1840 -- -- -- -- -- -- 5' 4 (1.626 m) 121.6 kg  11/29/23 1808 133/74 98.3 F (36.8 C) Oral 95 19 100 % -- --  11/29/23 1235 -- -- -- -- -- 99 % -- --  11/29/23 1232 131/63 -- -- 94 -- -- -- --  11/29/23 1230 -- -- -- -- -- 97 % -- --  11/29/23 1225 -- -- -- -- -- 97 % -- --  11/29/23 1222 (!) 143/81 -- -- 90 -- -- -- --  11/29/23 1151 (!) 142/74 98.7 F (37.1 C) Oral (!) 103 16 97 % -- --  FHTs: pending  Physical exam: General: Well nourished, well developed female in no acute distress. Abdomen: obese, nttp Cardiovascular: S1, S2 normal, no murmur, rub or gallop, regular rate and rhythm. I had patient place my fingers where she feels the discomfort and it is at the bottom of the xiphoid process/epigastric area.  Respiratory: CTAB Ext: no c/c/e in b/l LEs Skin: Warm and dry.   Medications: Current Facility-Administered Medications  Medication  Dose Route Frequency Provider Last Rate Last Admin   0.9 %  sodium chloride  infusion   Intravenous Continuous Cleatus Moccasin, MD 100 mL/hr at 11/30/23 0646 New Bag at 11/30/23 0646   acetaminophen  (OFIRMEV ) IV 1,000 mg  1,000 mg Intravenous Q6H PRN Cleatus Moccasin, MD 400 mL/hr at 11/30/23 0025 1,000 mg at 11/30/23 0025   calcium  carbonate (TUMS - dosed in mg elemental calcium ) chewable tablet 400 mg of elemental calcium   2 tablet Oral Q4H PRN Cleatus Moccasin, MD       HYDROmorphone (DILAUDID) injection 1 mg  1 mg Intravenous Q2H PRN Trudy Leeroy NOVAK, MD   1 mg at 11/30/23 9156   lactated ringers  infusion   Intravenous Continuous Trudy Leeroy NOVAK, MD 200 mL/hr at 11/29/23 1739 New Bag at 11/29/23 1739   metoCLOPramide  (REGLAN ) injection 10 mg  10 mg Intravenous Q6H PRN Cleatus Moccasin, MD       NIFEdipine (PROCARDIA-XL/NIFEDICAL-XL) 24 hr tablet 30 mg  30 mg Oral Daily Cleatus Moccasin, MD       ondansetron  (ZOFRAN ) injection 4 mg   4 mg Intravenous Q6H PRN Cleatus Moccasin, MD       prenatal multivitamin tablet 1 tablet  1 tablet Oral Q1200 Cleatus Moccasin, MD       zolpidem  (AMBIEN ) tablet 5 mg  5 mg Oral QHS PRN Cleatus Moccasin, MD       Labs:  Recent Labs  Lab 11/29/23 1213  WBC 6.7  HGB 11.4*  HCT 33.6*  PLT 346   Recent Labs  Lab 11/29/23 1213  NA 134*  K 3.4*  CL 102  CO2 22  BUN 5*  CREATININE 0.63  CALCIUM  8.3*  PROT 6.6  BILITOT 0.5  ALKPHOS 54  ALT 19  AST 16  GLUCOSE 90   Radiology:  No new imaging  Assessment & Plan:  Patient stable *Pregnancy: f/u FHTs for today *Chest/epigastric pain: repeat labs and EKG for today. S/s don't necessarily fit pancreatitis. Will f/u labs and if still elevated lipase can talk to GI or pharmacy and see if potential reaction to the PO azithromycin  and/or from the polypharmacy of taking the azithro and procardia at the same time. S/s don't sound respiratory so will hold off on cxr for now.  *CHTN: continue home procardia xl *IRBB and prolonged QT: f/u repeat ECG *Preterm: no issues *PPx: lovenox ordered, OOB ad lib *FEN/GI: NPO for now *Dispo: pending improvement in s/s and/or stabilization in labs and imaging  Bebe Izell Raddle MD Attending Center [facility] (Faculty Practice) GYN Consult Phone: [phone] (M-F, [phone]) & [phone]  (Off hours, weekends, holidays)

## 2023-11-30 NOTE — Progress Notes (Signed)
 Patient sleeping. Repeat labs (troponin, bnp, lipase, cmp, cbc) and ecg all negative D/w RN that if pt feeling fine, then okay for d/c to home  Bebe Izell Raddle MD Attending Center for [employer] (Faculty Practice) 11/30/2023 Time: [phone]

## 2023-11-30 NOTE — Social Work (Signed)
 CSW received consult for SDOH questionnaire Pt. Selected yes for issues with food, housing, and transportation. CSW met with MOB to offer support and complete assessment.    CSW met with MOB at bedside and introduced CSW role. MOB was with a visitor, and introduced her visitor as her father. MOB reported that she was also on face time with her aunt. CSW offered MOB privacy and she gave CSW permission to share all information with her family present. CSW asked MOB how she had been doing and MOB shared that she was doing alright. CSW explained the reason for the visit and assessed for needs.   MOB reported having financial concerns and explained her inability to make her rent payment this month. She shared that her current landlord has been working with her. CSW discussed housing resources and agreed to provide a paper copy of resources and send them to MOB's phone via Find Help. MOB gave CSW permission to send the resources.   CSW inquired about MOB's noted food insecurity. MOB reported that she currently receives SNAP benefits and has planned to apply for [facility] benefits. CSW offered to complete a  [facility] referral and MOB expressed appreciation for the referral. MOB mentioned that she received half of her SNAP benefits this month due to the current government shutdown. CSW provided MOB with a lis of community food resources and encouraged her to follow up if needed.   CSW inquired about MOB'S transportation needs. MOB reported that her car is currently out of service and does not have reliable transportation. CSW educated MOB about [facility] transportation and provided the contact information for MOB to follow up when needed. CSW encouraged MOB to contact transportation in advance to secure a ride. MOB verbalized understanding.   CSW inquired about MOB's noted concerns for depression MOB acknowledged that she has been experiencing depression and anxiety since the death of her grandmother in 20-Oct-2023. She  also expressed having anxiety symptoms surrounding motherhood with four children. MOB's Aunt described MOB's anxiety symptoms and stated that she believes MOB's anxiety also affects her blood pressure. CSW validated MOB's stressors and asked MOB if she had received treatment for her mental health. MOB reported that she had not received treatment and expressed interest in treatment for her mental health. CSW discussed mental health community resources and provided a a paper copy of resources. MOB stated that she would follow up.   CSW assessed MOB for additional needs. MOB reported none.  -CSW completed a [facility] referral   CSW identifies no further need for intervention and no barriers to discharge at this time.  Nat Quiet, MSW, LCSW Clinical Social Worker  [phone] 11/30/2023  2:36 PM

## 2023-11-30 NOTE — Progress Notes (Signed)
 OB note RN notified me that pt with 10/10 abdominal pain after eating  I went and saw her and she said that after she woke up, she started eating and had severe abdominal pain; it appears that patient ate some hospital cooked chicken breast and rice. She states that the pain is the left side of her belly and around her umbilicus.   Patient very ttp diffusely at belly and moderately distended.   Unsure etiology. Pt not on a ppi so will order that. I spoke to Gen Surg PA and no obvious etiology from their standpoint. Will get MRI A/P and if anything concerning contact them. Potential GI consult in AM  Bebe Izell Raddle MD Attending Center for [facility] (Faculty Practice) 11/30/2023 Time: 1600

## 2023-12-01 DIAGNOSIS — Z3A17 17 weeks gestation of pregnancy: Secondary | ICD-10-CM | POA: Diagnosis not present

## 2023-12-01 DIAGNOSIS — K859 Acute pancreatitis without necrosis or infection, unspecified: Secondary | ICD-10-CM | POA: Diagnosis not present

## 2023-12-01 LAB — COMPREHENSIVE METABOLIC PANEL WITH GFR
ALT: 12 U/L (ref 0–44)
AST: 11 U/L — ABNORMAL LOW (ref 15–41)
Albumin: 2.3 g/dL — ABNORMAL LOW (ref 3.5–5.0)
Alkaline Phosphatase: 49 U/L (ref 38–126)
Anion gap: 11 (ref 5–15)
BUN: <5 mg/dL — ABNORMAL LOW (ref 6–20)
CO2: 21 mmol/L — ABNORMAL LOW (ref 22–32)
Calcium: 8.5 mg/dL — ABNORMAL LOW (ref 8.9–10.3)
Chloride: 103 mmol/L (ref 98–111)
Creatinine, Ser: 0.59 mg/dL (ref 0.44–1.00)
GFR, Estimated: >60 mL/min (ref >60)
Glucose, Bld: 76 mg/dL (ref 70–99)
Potassium: 3.4 mmol/L — ABNORMAL LOW (ref 3.5–5.1)
Sodium: 135 mmol/L (ref 135–145)
Total Bilirubin: 0.7 mg/dL (ref 0.0–1.2)
Total Protein: 6 g/dL — ABNORMAL LOW (ref 6.5–8.1)

## 2023-12-01 LAB — MAGNESIUM: Magnesium: 1.9 mg/dL (ref 1.7–2.4)

## 2023-12-01 LAB — CBC
HCT: 31 % — ABNORMAL LOW (ref 36.0–46.0)
Hemoglobin: 10.5 g/dL — ABNORMAL LOW (ref 12.0–15.0)
MCH: 30.6 pg (ref 26.0–34.0)
MCHC: 33.9 g/dL (ref 30.0–36.0)
MCV: 90.4 fL (ref 80.0–100.0)
Platelets: 292 K/uL (ref 150–400)
RBC: 3.43 MIL/uL — ABNORMAL LOW (ref 3.87–5.11)
RDW: 13.2 % (ref 11.5–15.5)
WBC: 8.7 K/uL (ref 4.0–10.5)
nRBC: 0 % (ref 0.0–0.2)

## 2023-12-01 LAB — LIPASE, BLOOD: Lipase: 33 U/L (ref 11–51)

## 2023-12-01 NOTE — Plan of Care (Signed)
  Problem: Education: Goal: Knowledge of disease or condition will improve Outcome: Progressing Goal: Knowledge of the prescribed therapeutic regimen will improve Outcome: Progressing Goal: Individualized Educational Video(s) Outcome: Progressing   Problem: Clinical Measurements: Goal: Complications related to the disease process, condition or treatment will be avoided or minimized Outcome: Progressing   Problem: Education: Goal: Knowledge of General Education information will improve Description: Including pain rating scale, medication(s)/side effects and non-pharmacologic comfort measures Outcome: Progressing   Problem: Health Behavior/Discharge Planning: Goal: Ability to manage health-related needs will improve Outcome: Progressing   Problem: Clinical Measurements: Goal: Ability to maintain clinical measurements within normal limits will improve Outcome: Progressing Goal: Will remain free from infection Outcome: Progressing Goal: Diagnostic test results will improve Outcome: Progressing Goal: Respiratory complications will improve Outcome: Progressing Goal: Cardiovascular complication will be avoided Outcome: Progressing   Problem: Activity: Goal: Risk for activity intolerance will decrease Outcome: Progressing   Problem: Nutrition: Goal: Adequate nutrition will be maintained Outcome: Progressing   Problem: Coping: Goal: Level of anxiety will decrease Outcome: Progressing   Problem: Elimination: Goal: Will not experience complications related to bowel motility Outcome: Progressing Goal: Will not experience complications related to urinary retention Outcome: Progressing

## 2023-12-01 NOTE — Consult Note (Addendum)
 Consultation Note   Referring Provider:   Vina Solian, MD PCP: Patient, No Pcp Per Primary Gastroenterologist: [place]        Reason for Consultation:  Pancreatitis DOA: 11/29/2023         Hospital Day: 3   ATTENDING ADDENDUM I personally saw the patient and performed a substantive portion of this encounter (>50% time spent), including a complete performance of at least one of the key components (MDM, Hx and/or Exam), in conjunction with Vina Dasen.  33 year old [redacted] weeks pregnant who was admitted with abdominal pain following ingestion of a azithromycin .  Lipase mildly elevated, right upper quadrant ultrasound negative for gallstones.  LFTs normal.  She had an MRI of her abdomen which showed acute pancreatitis without any complications.  We have reviewed etiologies of acute pancreatitis with her.  She has no gallstones and her liver function testing was normal, making biliary pancreatitis very unlikely.  Her triglycerides are normal.  She does not drink any alcohol.  Her calcium  levels are normal.  She did have new medications in recent weeks to include baby aspirin, nifedipine, Zofran , and most recently 1 dose of azithromycin .  We have reviewed all of these and none of which appear associated with pancreatitis.  She has no family history of pancreatitis or pancreatic cancer.  The etiology of this remains unclear / idiopathic.  Overall she seems to have mild pancreatitis. Hopefully this will resolve with conservative measures and time.  We discussed bowel rest, IV fluids, and pain control.  Her renal function is normal.  She looks comfortable, has some mild abdominal tenderness on exam.  When she is ready to try clear liquids can advance her diet as tolerated.  I agree that follow-up MRCP in the upcoming months to reassess her pancreas when she is feeling well may be useful to exclude any other abnormality of her pancreas.  We will  reassess her tomorrow, call with questions.  Marcey Naval, MD [facility]    ASSESSMENT    33 year old female, [redacted] weeks gestation admitted with acute mild pancreatitis of unclear etiology.  Regarding risk factors: No evidence for gallstone disease. She does not consume alcohol. Doesn't appear to be medication related.  Her triglycerides are normal.  Her serum calcium  is not high.  No obvious pancreatic masses on MRI / MRCP.  Hypertension Recently started on Procardia  See PMH for any additional medical history  / medical problems   PLAN:   --Patient is afebrile.  No signs of hemoconcentration, renal function is normal.  She is still having some LUQ discomfort but interested in trying clear liquids so we will give those a try today.  --Continue supportive care --Will consider repeat imaging sometime in the next several weeks to make sure there has been resolution of pancreatitis and to rule out any underlying pancreatic lesions (low suspicion).    HPI   Brief history  patient is a 33 yo female who is ~ [redacted] weeks pregnant admitted with acute mild pancreatitis. She was in her usual state of heart until two days ago when she developed chest and upper and abdominal pain.  She presented to the ED on 11/9.   In the ED her WBC was normal, Lipase was  111, Hct 33.6%,  renal function normal, LFTs were normal. RUQ negative for gallstones or CBD duct dilation. She  has since undergone MRI / MRCP which showed acute pancreatitis, without drainable fluid collection or walled-off necrosis.  Moderate right hydroureteronephrosis, likely due to extrinsic compression from the gravid uterus, without visualized ureteral filling defect/calculus.  This is patient's first episode of pancreatitis. Reviewed any risk factors for pancreatitis and none are apparent..  She does not consume alcohol. No evidence for gallstone disease.   Her triglycerides are normal.  Her serum calcium  is not high.  No  obvious pancreatic masses on MRI / MRCP.  No obvious home medications as cause for pancreatitis.  Recently started on daily baby aspirin, Procardia and ondansetron  but none of these medications have a significant or any association with pancreatitis.  She does not take any vitamins, herbs or supplements.  She recently took 1 dose of erythromycin prescribed for chlamydia infection.  Her chest pain and abdominal pain is started within 30 minutes of taking this antibiotic.  Azithromycin  not usually associated with pancreatitis   Patient is still having abdominal discomfort, mainly in the LUQ.  Pain does not radiate through to her back.  She is interested in trying clear liquids   Labs and Imaging:  Recent Labs    11/29/23 1213 11/30/23 0915 12/01/23 0434  PROT 6.6 6.4* 6.0*  ALBUMIN 3.1* 2.7* 2.3*  AST 16 14* 11*  ALT 19 16 12   ALKPHOS 54 50 49  BILITOT 0.5 0.6 0.7   Recent Labs    11/29/23 1213 11/30/23 0915 12/01/23 0434  WBC 6.7 8.3 8.7  HGB 11.4* 11.6* 10.5*  HCT 33.6* 34.2* 31.0*  MCV 89.8 89.5 90.4  PLT 346 330 292   Recent Labs    11/29/23 1213 11/30/23 0915 12/01/23 0434  NA 134* 134* 135  K 3.4* 3.4* 3.4*  CL 102 101 103  CO2 22 22 21*  GLUCOSE 90 79 76  BUN 5* <5* <5*  CREATININE 0.63 0.60 0.59  CALCIUM  8.3* 8.4* 8.5*     MR PELVIS WO CONTRAST EXAM: MR ABDOMEN AND PELVIS WITHOUT IV CONTRAST 12/01/2023 12:08:01 AM  TECHNIQUE: Multisequence, multiplanar magnetic resonance images of the abdomen and pelvis without intravenous contrast.  COMPARISON: None available.  CLINICAL HISTORY: Abdominal pain, acute, nonlocalized; 17wks pregnancy.  FINDINGS:  LOWER THORAX: No pleural effusion.  LIVER: Unremarkable.  GALLBLADDER AND BILE DUCTS: No gallstone seen. No biliary ductal dilatation is evident.  PANCREAS: Peripancreatic fluid / inflammatory changes, suggesting acute pancreatitis, without drainable fluid collection/walled-off necrosis. No ductal  dilation.  SPLEEN: Unremarkable.  ADRENALS: Unremarkable.  KIDNEYS: Moderate right hydroureteronephrosis, likely secondary to extrinsic compression by the gravid uterus, without visualized filling defect/ureteral calculus.  STOMACH AND BOWEL: Limited evaluation of the stomach and bowel demonstrates no acute process. Normal appendix (series 16/image 31).  BLADDER: Unremarkable. No stone.  REPRODUCTIVE: Gravid uterus, dedicated fetal evaluation not performed. Placenta is posterior fundal and free of the cervical os.  VASCULATURE: No abdominal aortic aneurysm.  LYMPH NODES: No lymphadenopathy is evident.  Associated small volume abdominopelvic ascites, most prominent along the right mid abdomen.  IMPRESSION: 1. Normal appendix. 2. Acute pancreatitis, without drainable fluid collection or walled-off necrosis. 3. Moderate right hydroureteronephrosis, likely due to extrinsic compression from the gravid uterus, without visualized ureteral filling defect/calculus.  Electronically signed by: Pinkie Pebbles MD 12/01/2023 12:14 AM EST RP Workstation: [id number] MR ABDOMEN WO CONTRAST EXAM: MR ABDOMEN AND PELVIS WITHOUT IV CONTRAST 12/01/2023 12:08:01 AM  TECHNIQUE: Multisequence, multiplanar magnetic resonance images of the abdomen and pelvis without intravenous contrast.  COMPARISON: None available.  CLINICAL HISTORY: Abdominal pain, acute, nonlocalized; 17wks pregnancy.  FINDINGS:  LOWER THORAX: No pleural effusion.  LIVER: Unremarkable.  GALLBLADDER AND BILE DUCTS: No gallstone seen. No biliary ductal dilatation is evident.  PANCREAS: Peripancreatic fluid / inflammatory changes, suggesting acute pancreatitis, without drainable fluid collection/walled-off necrosis. No ductal dilation.  SPLEEN: Unremarkable.  ADRENALS: Unremarkable.  KIDNEYS: Moderate right hydroureteronephrosis, likely secondary to extrinsic compression by the gravid uterus,  without visualized filling defect/ureteral calculus.  STOMACH AND BOWEL: Limited evaluation of the stomach and bowel demonstrates no acute process. Normal appendix (series 16/image 31).  BLADDER: Unremarkable. No stone.  REPRODUCTIVE: Gravid uterus, dedicated fetal evaluation not performed. Placenta is posterior fundal and free of the cervical os.  VASCULATURE: No abdominal aortic aneurysm.  LYMPH NODES: No lymphadenopathy is evident.  Associated small volume abdominopelvic ascites, most prominent along the right mid abdomen.  IMPRESSION: 1. Normal appendix. 2. Acute pancreatitis, without drainable fluid collection or walled-off necrosis. 3. Moderate right hydroureteronephrosis, likely due to extrinsic compression from the gravid uterus, without visualized ureteral filling defect/calculus.  Electronically signed by: Pinkie Pebbles MD 12/01/2023 12:14 AM EST RP Workstation: [id number]    Past Medical History:  Diagnosis Date   Fracture of foot    Right foot fracture 10/2019   Hypertension    per pt report    Past Surgical History:  Procedure Laterality Date   NO PAST SURGERIES      Family History  Problem Relation Age of Onset   Hypertension Father    Stroke Father     Prior to Admission medications   Medication Sig Start Date End Date Taking? Authorizing Provider  acetaminophen  (TYLENOL ) 325 MG tablet Take 650 mg by mouth every 6 (six) hours as needed.   Yes [provider]  aspirin EC 81 MG tablet Take 1 tablet (81 mg total) by mouth daily. 11/17/23  Yes Warren-Hill, Camie LABOR, CNM  NIFEdipine (PROCARDIA XL) 30 MG 24 hr tablet Take 1 tablet (30 mg total) by mouth daily. 11/17/23  Yes Warren-Hill, Camie LABOR, CNM  Prenatal Vit-Fe Fumarate-FA (PRENATAL MULTIVITAMIN) TABS tablet Take 1 tablet by mouth daily at 12 noon.   Yes [provider]  amoxicillin  (AMOXIL ) 875 MG tablet Take 1 tablet (875 mg total) by mouth 2 (two) times daily. Patient not  taking: Reported on 11/04/2023 03/25/21   Cuthriell, Dorn BIRCH, [name]  Blood Pressure Monitoring DEVI 1 each by Does not apply route once a week. 11/04/23   Warren-Hill, Camie LABOR, CNM  fluticasone  (FLONASE ) 50 MCG/ACT nasal spray Place 2 sprays into both nostrils daily. Patient not taking: Reported on 11/04/2023 03/08/23   Teresa Almarie LABOR, [name]  ibuprofen  (ADVIL ) 600 MG tablet Take 1 tablet (600 mg total) by mouth every 6 (six) hours as needed. Patient not taking: Reported on 11/04/2023 07/26/22   Enedelia Dorna HERO, FNP  ibuprofen  (ADVIL ) 600 MG tablet Take 1 tablet (600 mg total) by mouth every 6 (six) hours as needed. Patient not taking: Reported on 11/04/2023 10/21/22   Horton, Charmaine FALCON, MD  magic mouthwash w/lidocaine  SOLN Take 5 mLs by mouth 4 (four) times daily. Patient not taking: Reported on 11/04/2023 03/25/21   Cuthriell, Dorn BIRCH, [name]  methocarbamol  (ROBAXIN ) 500 MG tablet Take 1 tablet (500 mg total) by mouth 2 (two) times daily. Patient not taking: Reported on 11/04/2023 07/26/22   Enedelia Dorna HERO, FNP  misoprostol  (CYTOTEC )  200 MCG tablet Place 1 tablet (200 mcg total) vaginally once for 1 dose. At bedtime evening prior to procedure 09/20/20 09/20/20  Carlin Rollene HERO, CNM  ondansetron  (ZOFRAN -ODT) 4 MG disintegrating tablet Take 1 tablet (4 mg total) by mouth every 8 (eight) hours as needed for nausea. 11/17/23   Warren-Hill, Camie LABOR, CNM  sertraline  (ZOLOFT ) 50 MG tablet Take 2 tablets (100 mg total) by mouth daily. Take half a tablet for the first 5-7 days. Patient not taking: Reported on 11/04/2023 09/20/20   Carlin Rollene HERO, CNM    Current Facility-Administered Medications  Medication Dose Route Frequency Provider Last Rate Last Admin   acetaminophen  (TYLENOL ) tablet 1,000 mg  1,000 mg Oral Q6H PRN Izell Harari, MD   1,000 mg at 12/01/23 1107   calcium  carbonate (TUMS - dosed in mg elemental calcium ) chewable tablet 400 mg of elemental calcium   2 tablet Oral Q4H PRN  Cleatus Moccasin, MD       enoxaparin (LOVENOX) injection 60 mg  60 mg Subcutaneous Q24H Izell Harari, MD   60 mg at 12/01/23 1012   HYDROmorphone (DILAUDID) injection 1 mg  1 mg Intravenous Q2H PRN Trudy Leeroy NOVAK, MD   1 mg at 12/01/23 1607   lactated ringers  infusion   Intravenous Continuous Izell Harari, MD 75 mL/hr at 12/01/23 1121 New Bag at 12/01/23 1121   metoCLOPramide  (REGLAN ) injection 10 mg  10 mg Intravenous Q6H PRN Cleatus Moccasin, MD       NIFEdipine (PROCARDIA-XL/NIFEDICAL-XL) 24 hr tablet 30 mg  30 mg Oral Daily Cleatus Moccasin, MD   30 mg at 12/01/23 1012   ondansetron  (ZOFRAN ) injection 4 mg  4 mg Intravenous Q6H PRN Cleatus Moccasin, MD       pantoprazole (PROTONIX) injection 40 mg  40 mg Intravenous Q24H Izell Harari, MD   40 mg at 12/01/23 1613   prenatal multivitamin tablet 1 tablet  1 tablet Oral Q1200 Cleatus Moccasin, MD   1 tablet at 12/01/23 1107   zolpidem  (AMBIEN ) tablet 5 mg  5 mg Oral QHS PRN Cleatus Moccasin, MD        Allergies as of 11/29/2023   (No Known Allergies)    Social History   Socioeconomic History   Marital status: Single    Spouse name: Not on file   Number of children: Not on file   Years of education: Not on file   Highest education level: Not on file  Occupational History   Not on file  Tobacco Use   Smoking status: Former    Types: Cigars   Smokeless tobacco: Never   Tobacco comments:    1  day  Vaping Use   Vaping status: Former  Substance and Sexual Activity   Alcohol use: Not Currently   Drug use: No   Sexual activity: Yes    Birth control/protection: None    Comment: no bcm since IUD removed 08/2019  Other Topics Concern   Not on file  Social History Narrative   ** Merged History Encounter **       Social Drivers of Health   Financial Resource Strain: Low Risk (10/13/2023)   Received from [facility]   Overall Financial Resource Strain (CARDIA)    How hard is it for you to pay for the very basics like food,  housing, medical care, and heating?: Not hard at all  Food Insecurity: Food Insecurity Present (11/30/2023)   Hunger Vital Sign    Worried About Running Out of Food  in the Last Year: Sometimes true    Ran Out of Food in the Last Year: Sometimes true  Transportation Needs: Unmet Transportation Needs (11/30/2023)   PRAPARE - Administrator, Civil Service (Medical): Yes    Lack of Transportation (Non-Medical): Yes  Physical Activity: Not on file  Stress: Not on file  Social Connections: Not on file  Intimate Partner Violence: Not At Risk (11/30/2023)   Humiliation, Afraid, Rape, and Kick questionnaire    Fear of Current or Ex-Partner: No    Emotionally Abused: No    Physically Abused: No    Sexually Abused: No     Code Status   Code Status: Full Code  Review of Systems: All systems reviewed and negative except where noted in HPI.  Physical Exam: Vital signs in last 24 hours: Temp:  [98.4 F (36.9 C)-98.7 F (37.1 C)] 98.7 F (37.1 C) (11/11 0846) Pulse Rate:  [93-109] 109 (11/11 0846) Resp:  [18-20] 20 (11/11 1255) BP: (117-127)/(59-66) 124/66 (11/11 0846) SpO2:  [94 %] 94 % (11/11 0846)    General:  Pleasant female in NAD Psych:  Cooperative. Normal mood and affect Eyes: Pupils equal Ears:  Normal auditory acuity Nose: No deformity, discharge or lesions Neck:  Supple, no masses felt Lungs:  Clear to auscultation.  Heart:  Regular rate, regular rhythm.  Abdomen:  Soft, nondistended, moderate LUQ tenderness,  active bowel sounds, no masses felt Rectal :  Deferred Msk: Symmetrical without gross deformities.  Neurologic:  Alert, oriented, grossly normal neurologically Extremities : No edema Skin:  Intact without significant lesions.    Intake/Output from previous day: 11/10 0701 - 11/11 0700 In: -  Out: 1200 [Urine:1200] Intake/Output this shift:  Total I/O In: -  Out: 650 [Urine:650]   Vina Dasen, NP-C   12/01/2023, 5:03 PM

## 2023-12-01 NOTE — Progress Notes (Signed)
 Patient ID: Christina Riggs, female   DOB: Jul 06, 1990, 33 y.o.   MRN: [id number] FACULTY PRACTICE ANTEPARTUM(COMPREHENSIVE) NOTE  Christina Riggs is a 33 y.o. [id number] at [redacted]w[redacted]d by midtrimester ultrasound who is admitted for abdominal pain, diagnosed with pancreatitis.   Fetal presentation is unsure. Length of Stay:  1  Days  Subjective: Feels better, pain is decreased. She had anxiety last night that was treated Patient reports the fetal movement as flutters. Patient reports uterine contraction  activity as none. Patient reports  vaginal bleeding as none. Patient describes fluid per vagina as None.  Vitals:  Blood pressure (!) 117/59, pulse (!) 104, temperature 98.4 F (36.9 C), temperature source Oral, resp. rate 18, height 5' 4 (1.626 m), weight 121.6 kg, last menstrual period 08/07/2023, SpO2 94%. Physical Examination:  General appearance - alert, well appearing, and in no distress Heart - normal rate and regular rhythm Abdomen - soft, nontender, nondistended Fundal Height:  size equals dates Cervical Exam: Not evaluated.  Extremities: extremities normal, atraumatic, no cyanosis or edema and Homans sign is negative Membranes:intact Fetal Heart Rate A   Mode Doppler filed at 11/29/2023 1151  Baseline Rate (A) 144 bpm filed at 11/29/2023 1151   Fetal Monitoring:    Labs:  Results for orders placed or performed during the hospital encounter of 11/29/23 (from the past 24 hours)  Brain natriuretic peptide   Collection Time: 11/30/23  9:15 AM  Result Value Ref Range   B Natriuretic Peptide 73.6 0.0 - 100.0 pg/mL  Comprehensive metabolic panel   Collection Time: 11/30/23  9:15 AM  Result Value Ref Range   Sodium 134 (L) 135 - 145 mmol/L   Potassium 3.4 (L) 3.5 - 5.1 mmol/L   Chloride 101 98 - 111 mmol/L   CO2 22 22 - 32 mmol/L   Glucose, Bld 79 70 - 99 mg/dL   BUN <5 (L) 6 - 20 mg/dL   Creatinine, Ser 9.39 0.44 - 1.00 mg/dL   Calcium  8.4 (L) 8.9 - 10.3 mg/dL    Total Protein 6.4 (L) 6.5 - 8.1 g/dL   Albumin 2.7 (L) 3.5 - 5.0 g/dL   AST 14 (L) 15 - 41 U/L   ALT 16 0 - 44 U/L   Alkaline Phosphatase 50 38 - 126 U/L   Total Bilirubin 0.6 0.0 - 1.2 mg/dL   GFR, Estimated >39 >39 mL/min   Anion gap 11 5 - 15  CBC   Collection Time: 11/30/23  9:15 AM  Result Value Ref Range   WBC 8.3 4.0 - 10.5 K/uL   RBC 3.82 (L) 3.87 - 5.11 MIL/uL   Hemoglobin 11.6 (L) 12.0 - 15.0 g/dL   HCT 65.7 (L) 63.9 - 53.9 %   MCV 89.5 80.0 - 100.0 fL   MCH 30.4 26.0 - 34.0 pg   MCHC 33.9 30.0 - 36.0 g/dL   RDW 86.8 88.4 - 84.4 %   Platelets 330 150 - 400 K/uL   nRBC 0.0 0.0 - 0.2 %  Lipase, blood   Collection Time: 11/30/23  9:15 AM  Result Value Ref Range   Lipase 47 11 - 51 U/L  Troponin I (High Sensitivity)   Collection Time: 11/30/23  9:15 AM  Result Value Ref Range   Troponin I (High Sensitivity) 3 <18 ng/L  CBC   Collection Time: 12/01/23  4:34 AM  Result Value Ref Range   WBC 8.7 4.0 - 10.5 K/uL   RBC 3.43 (L) 3.87 - 5.11 MIL/uL  Hemoglobin 10.5 (L) 12.0 - 15.0 g/dL   HCT 68.9 (L) 63.9 - 53.9 %   MCV 90.4 80.0 - 100.0 fL   MCH 30.6 26.0 - 34.0 pg   MCHC 33.9 30.0 - 36.0 g/dL   RDW 86.7 88.4 - 84.4 %   Platelets 292 150 - 400 K/uL   nRBC 0.0 0.0 - 0.2 %  Comprehensive metabolic panel   Collection Time: 12/01/23  4:34 AM  Result Value Ref Range   Sodium 135 135 - 145 mmol/L   Potassium 3.4 (L) 3.5 - 5.1 mmol/L   Chloride 103 98 - 111 mmol/L   CO2 21 (L) 22 - 32 mmol/L   Glucose, Bld 76 70 - 99 mg/dL   BUN <5 (L) 6 - 20 mg/dL   Creatinine, Ser 9.40 0.44 - 1.00 mg/dL   Calcium  8.5 (L) 8.9 - 10.3 mg/dL   Total Protein 6.0 (L) 6.5 - 8.1 g/dL   Albumin 2.3 (L) 3.5 - 5.0 g/dL   AST 11 (L) 15 - 41 U/L   ALT 12 0 - 44 U/L   Alkaline Phosphatase 49 38 - 126 U/L   Total Bilirubin 0.7 0.0 - 1.2 mg/dL   GFR, Estimated >39 >39 mL/min   Anion gap 11 5 - 15  Lipase, blood   Collection Time: 12/01/23  4:34 AM  Result Value Ref Range   Lipase 33 11 -  51 U/L     Medications:  Scheduled  enoxaparin (LOVENOX) injection  60 mg Subcutaneous Q24H   NIFEdipine  30 mg Oral Daily   pantoprazole (PROTONIX) IV  40 mg Intravenous Q24H   potassium chloride   20 mEq Oral BID   prenatal multivitamin  1 tablet Oral Q1200   I have reviewed the patient's current medications.  ASSESSMENT: Patient Active Problem List   Diagnosis Date Noted   BMI 45.0-49.9, adult (HCC) 11/30/2023   Chronic hypertension 11/29/2023   Pancreatitis 11/29/2023   Supervision of other normal pregnancy, antepartum 11/04/2023   ASCUS of cervix with negative high risk HPV 09/27/2020   History of depressive symptoms 09/20/2020    PLAN:GI consult for pancreatitis  MR ABDOMEN WO CONTRAST Final result 12/01/2023 12:08 AM      EXAM: MR ABDOMEN AND PELVIS WITHOUT IV CONTRAST 12/01/2023 12:08:01 AM  TECHNIQUE: Multisequence, multiplanar magnetic resonance images of the abdomen and pelvis without intravenous contrast.  COMPARISON: None available. ...   Study Result  Narrative & Impression  EXAM: MR ABDOMEN AND PELVIS WITHOUT IV CONTRAST 12/01/2023 12:08:01 AM   TECHNIQUE: Multisequence, multiplanar magnetic resonance images of the abdomen and pelvis without intravenous contrast.   COMPARISON: None available.   CLINICAL HISTORY: Abdominal pain, acute, nonlocalized; 17wks pregnancy.   FINDINGS:   LOWER THORAX: No pleural effusion.   LIVER: Unremarkable.   GALLBLADDER AND BILE DUCTS: No gallstone seen. No biliary ductal dilatation is evident.   PANCREAS: Peripancreatic fluid / inflammatory changes, suggesting acute pancreatitis, without drainable fluid collection/walled-off necrosis. No ductal dilation.   SPLEEN: Unremarkable.   ADRENALS: Unremarkable.   KIDNEYS: Moderate right hydroureteronephrosis, likely secondary to extrinsic compression by the gravid uterus, without visualized filling defect/ureteral calculus.   STOMACH AND  BOWEL: Limited evaluation of the stomach and bowel demonstrates no acute process. Normal appendix (series 16/image 31).   BLADDER: Unremarkable. No stone.   REPRODUCTIVE: Gravid uterus, dedicated fetal evaluation not performed. Placenta is posterior fundal and free of the cervical os.   VASCULATURE: No abdominal aortic aneurysm.   LYMPH NODES: No  lymphadenopathy is evident.   Associated small volume abdominopelvic ascites, most prominent along the right mid abdomen.   IMPRESSION: 1. Normal appendix. 2. Acute pancreatitis, without drainable fluid collection or walled-off necrosis. 3. Moderate right hydroureteronephrosis, likely due to extrinsic compression from the gravid uterus, without visualized ureteral filling defect/calculus.   Electronically signed by: Pinkie Pebbles MD 12/01/2023 12:14 AM EST RP Workstation: [id number]     Christina Riggs 12/01/2023,7:47 AM

## 2023-12-01 NOTE — Plan of Care (Signed)

## 2023-12-02 DIAGNOSIS — K859 Acute pancreatitis without necrosis or infection, unspecified: Secondary | ICD-10-CM | POA: Diagnosis not present

## 2023-12-02 DIAGNOSIS — Z3A17 17 weeks gestation of pregnancy: Secondary | ICD-10-CM | POA: Diagnosis not present

## 2023-12-02 MED ORDER — OXYCODONE HCL 5 MG PO TABS
5.0000 mg | ORAL_TABLET | Freq: Four times a day (QID) | ORAL | Status: DC | PRN
  Administered 2023-12-03: 5 mg via ORAL
  Filled 2023-12-02 (×2): qty 1

## 2023-12-02 MED ORDER — HYDROMORPHONE HCL 1 MG/ML IJ SOLN
1.0000 mg | INTRAMUSCULAR | Status: DC | PRN
  Administered 2023-12-02 (×3): 1 mg via INTRAVENOUS
  Filled 2023-12-02 (×3): qty 1

## 2023-12-02 NOTE — Progress Notes (Signed)
 FACULTY PRACTICE ANTEPARTUM(COMPREHENSIVE) NOTE  Christina Riggs is a 33 y.o. [phone] with Estimated Date of Delivery: 05/04/24   By  early ultrasound [redacted]w[redacted]d  who is admitted for pancreatitis.     Length of Stay:  2  Days  Date of admission:11/29/2023  Subjective: Patient resting comfortably in bed, reports no acute complaints overnight.  It sounds like she was able to tolerate clears.  Denies vomiting some mild nausea.  Also notes headache that improved with pain medicine this a.m.  Patient reports the fetal movement as feeling quickening. Patient reports uterine contraction  activity as none. Patient reports  vaginal bleeding as none. Patient describes fluid per vagina as None.  Vitals:  Blood pressure (!) 108/53, pulse 85, temperature 98.4 F (36.9 C), temperature source Oral, resp. rate 18, height 5' 4 (1.626 m), weight 121.6 kg, last menstrual period 08/07/2023, SpO2 94%. Vitals:   12/01/23 1255 12/01/23 1805 12/01/23 2017 12/01/23 2359  BP:  126/66 (!) 105/57 (!) 108/53  Pulse:  94 91 85  Resp: 20 19 18 18   Temp:  98.1 F (36.7 C) 97.6 F (36.4 C) 98.4 F (36.9 C)  TempSrc:  Axillary Oral Oral  SpO2:  95% 95% 94%  Weight:      Height:       Physical Examination:  General appearance -no distress, slightly ill-appearing Mental status - normal mood, behavior, speech, dress, motor activity, and thought processes Chest -normal respiratory effort, CTAB Heart - normal rate and regular rhythm Abdomen -obese, soft, some LUQ tenderness noted, no rebound or guarding Musculoskeletal - no calf tenderness bilaterally Extremities - no pedal edema noted Skin -warm and dry  Labs:  No results found for this or any previous visit (from the past 24 hours).  Imaging Studies:    MR ABDOMEN WO CONTRAST Result Date: 12/01/2023 EXAM: MR ABDOMEN AND PELVIS WITHOUT IV CONTRAST 12/01/2023 12:08:01 AM TECHNIQUE: Multisequence, multiplanar magnetic resonance images of the abdomen and  pelvis without intravenous contrast. COMPARISON: None available. CLINICAL HISTORY: Abdominal pain, acute, nonlocalized; 17wks pregnancy. FINDINGS: LOWER THORAX: No pleural effusion. LIVER: Unremarkable. GALLBLADDER AND BILE DUCTS: No gallstone seen. No biliary ductal dilatation is evident. PANCREAS: Peripancreatic fluid / inflammatory changes, suggesting acute pancreatitis, without drainable fluid collection/walled-off necrosis. No ductal dilation. SPLEEN: Unremarkable. ADRENALS: Unremarkable. KIDNEYS: Moderate right hydroureteronephrosis, likely secondary to extrinsic compression by the gravid uterus, without visualized filling defect/ureteral calculus. STOMACH AND BOWEL: Limited evaluation of the stomach and bowel demonstrates no acute process. Normal appendix (series 16/image 31). BLADDER: Unremarkable. No stone. REPRODUCTIVE: Gravid uterus, dedicated fetal evaluation not performed. Placenta is posterior fundal and free of the cervical os. VASCULATURE: No abdominal aortic aneurysm. LYMPH NODES: No lymphadenopathy is evident. Associated small volume abdominopelvic ascites, most prominent along the right mid abdomen. IMPRESSION: 1. Normal appendix. 2. Acute pancreatitis, without drainable fluid collection or walled-off necrosis. 3. Moderate right hydroureteronephrosis, likely due to extrinsic compression from the gravid uterus, without visualized ureteral filling defect/calculus. Electronically signed by: Pinkie Pebbles MD 12/01/2023 12:14 AM EST RP Workstation: [id number]   MR PELVIS WO CONTRAST Result Date: 12/01/2023 EXAM: MR ABDOMEN AND PELVIS WITHOUT IV CONTRAST 12/01/2023 12:08:01 AM TECHNIQUE: Multisequence, multiplanar magnetic resonance images of the abdomen and pelvis without intravenous contrast. COMPARISON: None available. CLINICAL HISTORY: Abdominal pain, acute, nonlocalized; 17wks pregnancy. FINDINGS: LOWER THORAX: No pleural effusion. LIVER: Unremarkable. GALLBLADDER AND BILE DUCTS: No  gallstone seen. No biliary ductal dilatation is evident. PANCREAS: Peripancreatic fluid / inflammatory changes, suggesting acute pancreatitis, without drainable fluid collection/walled-off  necrosis. No ductal dilation. SPLEEN: Unremarkable. ADRENALS: Unremarkable. KIDNEYS: Moderate right hydroureteronephrosis, likely secondary to extrinsic compression by the gravid uterus, without visualized filling defect/ureteral calculus. STOMACH AND BOWEL: Limited evaluation of the stomach and bowel demonstrates no acute process. Normal appendix (series 16/image 31). BLADDER: Unremarkable. No stone. REPRODUCTIVE: Gravid uterus, dedicated fetal evaluation not performed. Placenta is posterior fundal and free of the cervical os. VASCULATURE: No abdominal aortic aneurysm. LYMPH NODES: No lymphadenopathy is evident. Associated small volume abdominopelvic ascites, most prominent along the right mid abdomen. IMPRESSION: 1. Normal appendix. 2. Acute pancreatitis, without drainable fluid collection or walled-off necrosis. 3. Moderate right hydroureteronephrosis, likely due to extrinsic compression from the gravid uterus, without visualized ureteral filling defect/calculus. Electronically signed by: Pinkie Pebbles MD 12/01/2023 12:14 AM EST RP Workstation: [id number]   US  ABDOMEN LIMITED RUQ (LIVER/GB) Result Date: 11/29/2023 EXAM: Right Upper Quadrant Abdominal Ultrasound 11/29/2023 09:26:55 PM TECHNIQUE: Real-time ultrasonography of the right upper quadrant of the abdomen was performed. COMPARISON: None available. CLINICAL HISTORY: Right upper quadrant abdominal pain. FINDINGS: LIVER: The liver demonstrates normal echogenicity. No intrahepatic biliary ductal dilatation. No evidence of mass. BILIARY SYSTEM: No pericholecystic fluid or wall thickening. No cholelithiasis. Negative sonographic Murphy's sign. Common bile duct measures 5.4 mm. RIGHT KIDNEY: The right kidney is grossly unremarkable in appearances without evidence of  hydronephrosis, echogenic calculi or worrisome mass lesions. PANCREAS: Visualized portions of the pancreas are unremarkable. OTHER: No right upper quadrant ascites. IMPRESSION: 1. No acute findings. Electronically signed by: Greig Pique MD 11/29/2023 09:29 PM EST RP Workstation: [id number]     ASSESSMENT: [id number] [redacted]w[redacted]d Estimated Date of Delivery: 05/04/24  Patient Active Problem List   Diagnosis Date Noted   [redacted] weeks gestation of pregnancy 12/01/2023   BMI 45.0-49.9, adult (HCC) 11/30/2023   Chronic hypertension 11/29/2023   Pancreatitis 11/29/2023   Supervision of other normal pregnancy, antepartum 11/04/2023   ASCUS of cervix with negative high risk HPV 09/27/2020   History of depressive symptoms 09/20/2020    PLAN: 1) 1) Pancreatitis -s/p GI consult -tolerating some clears- will slowly advance diet as tolerated -continue IV protonix scheduled -Reglan  prn  2) Chronic HTN - Continue Procardia  3) Maternal care -routine OB care  DISP: Once tolerating diet will transition to oral medication  Christina Riggs 12/02/2023,7:15 AM

## 2023-12-02 NOTE — Progress Notes (Signed)
 Progress Note   Subjective  Patient states she is improving. Has an appetite, wants to try bland foods.    Objective   Vital signs in last 24 hours: Temp:  [97.6 F (36.4 C)-98.6 F (37 C)] 98.6 F (37 C) (11/12 0912) Pulse Rate:  [85-96] 96 (11/12 0912) Resp:  [18-20] 18 (11/12 0912) BP: (105-135)/(53-71) 135/71 (11/12 0912) SpO2:  [94 %-95 %] 94 % (11/12 0912) Last BM Date :  (patient states she had one last week but cannot remember the day) General:    AA female in NAD Neurologic:  Alert and oriented,  grossly normal neurologically. Psych:  Cooperative. Normal mood and affect.  Intake/Output from previous day: 11/11 0701 - 11/12 0700 In: -  Out: 2150 [Urine:2150] Intake/Output this shift: Total I/O In: -  Out: 650 [Urine:650]  Lab Results: Recent Labs    11/30/23 0915 12/01/23 0434  WBC 8.3 8.7  HGB 11.6* 10.5*  HCT 34.2* 31.0*  PLT 330 292   BMET Recent Labs    11/30/23 0915 12/01/23 0434  NA 134* 135  K 3.4* 3.4*  CL 101 103  CO2 22 21*  GLUCOSE 79 76  BUN <5* <5*  CREATININE 0.60 0.59  CALCIUM  8.4* 8.5*   LFT Recent Labs    12/01/23 0434  PROT 6.0*  ALBUMIN 2.3*  AST 11*  ALT 12  ALKPHOS 49  BILITOT 0.7   PT/INR No results for input(s): LABPROT, INR in the last 72 hours.  Studies/Results: MR ABDOMEN WO CONTRAST Result Date: 12/01/2023 EXAM: MR ABDOMEN AND PELVIS WITHOUT IV CONTRAST 12/01/2023 12:08:01 AM TECHNIQUE: Multisequence, multiplanar magnetic resonance images of the abdomen and pelvis without intravenous contrast. COMPARISON: None available. CLINICAL HISTORY: Abdominal pain, acute, nonlocalized; 17wks pregnancy. FINDINGS: LOWER THORAX: No pleural effusion. LIVER: Unremarkable. GALLBLADDER AND BILE DUCTS: No gallstone seen. No biliary ductal dilatation is evident. PANCREAS: Peripancreatic fluid / inflammatory changes, suggesting acute pancreatitis, without drainable fluid collection/walled-off necrosis. No ductal  dilation. SPLEEN: Unremarkable. ADRENALS: Unremarkable. KIDNEYS: Moderate right hydroureteronephrosis, likely secondary to extrinsic compression by the gravid uterus, without visualized filling defect/ureteral calculus. STOMACH AND BOWEL: Limited evaluation of the stomach and bowel demonstrates no acute process. Normal appendix (series 16/image 31). BLADDER: Unremarkable. No stone. REPRODUCTIVE: Gravid uterus, dedicated fetal evaluation not performed. Placenta is posterior fundal and free of the cervical os. VASCULATURE: No abdominal aortic aneurysm. LYMPH NODES: No lymphadenopathy is evident. Associated small volume abdominopelvic ascites, most prominent along the right mid abdomen. IMPRESSION: 1. Normal appendix. 2. Acute pancreatitis, without drainable fluid collection or walled-off necrosis. 3. Moderate right hydroureteronephrosis, likely due to extrinsic compression from the gravid uterus, without visualized ureteral filling defect/calculus. Electronically signed by: Pinkie Pebbles MD 12/01/2023 12:14 AM EST RP Workstation: [id number]   MR PELVIS WO CONTRAST Result Date: 12/01/2023 EXAM: MR ABDOMEN AND PELVIS WITHOUT IV CONTRAST 12/01/2023 12:08:01 AM TECHNIQUE: Multisequence, multiplanar magnetic resonance images of the abdomen and pelvis without intravenous contrast. COMPARISON: None available. CLINICAL HISTORY: Abdominal pain, acute, nonlocalized; 17wks pregnancy. FINDINGS: LOWER THORAX: No pleural effusion. LIVER: Unremarkable. GALLBLADDER AND BILE DUCTS: No gallstone seen. No biliary ductal dilatation is evident. PANCREAS: Peripancreatic fluid / inflammatory changes, suggesting acute pancreatitis, without drainable fluid collection/walled-off necrosis. No ductal dilation. SPLEEN: Unremarkable. ADRENALS: Unremarkable. KIDNEYS: Moderate right hydroureteronephrosis, likely secondary to extrinsic compression by the gravid uterus, without visualized filling defect/ureteral calculus. STOMACH AND BOWEL:  Limited evaluation of the stomach and bowel demonstrates no acute process. Normal appendix (series 16/image 31). BLADDER:  Unremarkable. No stone. REPRODUCTIVE: Gravid uterus, dedicated fetal evaluation not performed. Placenta is posterior fundal and free of the cervical os. VASCULATURE: No abdominal aortic aneurysm. LYMPH NODES: No lymphadenopathy is evident. Associated small volume abdominopelvic ascites, most prominent along the right mid abdomen. IMPRESSION: 1. Normal appendix. 2. Acute pancreatitis, without drainable fluid collection or walled-off necrosis. 3. Moderate right hydroureteronephrosis, likely due to extrinsic compression from the gravid uterus, without visualized ureteral filling defect/calculus. Electronically signed by: Pinkie Pebbles MD 12/01/2023 12:14 AM EST RP Workstation: [id number]       Assessment / Plan:    33 y/o female here with the following:  Acute pancreatitis - idiopathic [redacted] weeks pregnant  Overall improved. See yesterday's note for full details. Unclear what precipitated her pancreatitis, but she is improving.  I think okay to advance to a low fat / bland diet as she tolerates. Labs stable, I think she has had mild pancreatitis. If she can eat / drink and pain improving / tolerable then can go home. I counseled her that her pain may take several days to weeks to fully resolve, but hopefully continues to slowly improve with time. If any worsening along her course / recurrence then she needs to seek re-evaluation.  I do think reasonable to do an MRCP as an outpatient once she has recovered from this, make sure she has healed and exclude any small growths / problems with her pancreas. I will coordinate follow up in my office after her discharge.  We will sign off for now, please call with questions / concerns in the interim.  Marcey Naval, MD [facility]

## 2023-12-03 LAB — TYPE AND SCREEN
ABO/RH(D): O POS
Antibody Screen: POSITIVE
Unit division: 0
Unit division: 0

## 2023-12-03 LAB — BPAM RBC
Blood Product Expiration Date: 202512042359
Blood Product Expiration Date: 202512062359
ISSUE DATE / TIME: 202511080313
Unit Type and Rh: 5100
Unit Type and Rh: 5100

## 2023-12-03 MED ORDER — OXYCODONE HCL 5 MG PO TABS: 5.0000 mg | ORAL_TABLET | Freq: Four times a day (QID) | ORAL | 0 refills | Status: DC | PRN

## 2023-12-03 NOTE — Discharge Summary (Signed)
 Antenatal Physician Discharge Summary  Patient ID: Christina Riggs MRN: [id number] DOB/AGE: 1990-12-18 33 y.o.  Admit date: 11/29/2023 Discharge date: 12/03/2023  Admission Diagnoses: ANTENATAL DISCHARGE SUMMARY   Discharge Diagnoses: acute pancreatitis  Prenatal Procedures: none  Intrapartum Procedures: none  Significant Diagnostic Studies:  Results for orders placed or performed during the hospital encounter of 11/29/23 (from the past week)  CBC   Collection Time: 11/29/23 12:13 PM  Result Value Ref Range   WBC 6.7 4.0 - 10.5 K/uL   RBC 3.74 (L) 3.87 - 5.11 MIL/uL   Hemoglobin 11.4 (L) 12.0 - 15.0 g/dL   HCT 66.3 (L) 63.9 - 53.9 %   MCV 89.8 80.0 - 100.0 fL   MCH 30.5 26.0 - 34.0 pg   MCHC 33.9 30.0 - 36.0 g/dL   RDW 87.0 88.4 - 84.4 %   Platelets 346 150 - 400 K/uL   nRBC 0.0 0.0 - 0.2 %  Comprehensive metabolic panel with GFR   Collection Time: 11/29/23 12:13 PM  Result Value Ref Range   Sodium 134 (L) 135 - 145 mmol/L   Potassium 3.4 (L) 3.5 - 5.1 mmol/L   Chloride 102 98 - 111 mmol/L   CO2 22 22 - 32 mmol/L   Glucose, Bld 90 70 - 99 mg/dL   BUN 5 (L) 6 - 20 mg/dL   Creatinine, Ser 9.36 0.44 - 1.00 mg/dL   Calcium  8.3 (L) 8.9 - 10.3 mg/dL   Total Protein 6.6 6.5 - 8.1 g/dL   Albumin 3.1 (L) 3.5 - 5.0 g/dL   AST 16 15 - 41 U/L   ALT 19 0 - 44 U/L   Alkaline Phosphatase 54 38 - 126 U/L   Total Bilirubin 0.5 0.0 - 1.2 mg/dL   GFR, Estimated >39 >39 mL/min   Anion gap 10 5 - 15  Lipase, blood   Collection Time: 11/29/23 12:13 PM  Result Value Ref Range   Lipase 111 (H) 11 - 51 U/L  Troponin I (High Sensitivity)   Collection Time: 11/29/23 12:13 PM  Result Value Ref Range   Troponin I (High Sensitivity) 3 <18 ng/L  Urinalysis, Routine w reflex microscopic -Urine, Clean Catch   Collection Time: 11/29/23  2:54 PM  Result Value Ref Range   Color, Urine YELLOW YELLOW   APPearance CLEAR CLEAR   Specific Gravity, Urine 1.021 1.005 - 1.030   pH 7.0 5.0  - 8.0   Glucose, UA NEGATIVE NEGATIVE mg/dL   Hgb urine dipstick NEGATIVE NEGATIVE   Bilirubin Urine NEGATIVE NEGATIVE   Ketones, ur 80 (A) NEGATIVE mg/dL   Protein, ur NEGATIVE NEGATIVE mg/dL   Nitrite NEGATIVE NEGATIVE   Leukocytes,Ua TRACE (A) NEGATIVE   RBC / HPF 0-5 0 - 5 RBC/hpf   WBC, UA 0-5 0 - 5 WBC/hpf   Bacteria, UA RARE (A) NONE SEEN   Squamous Epithelial / HPF 0-5 0 - 5 /HPF   Mucus PRESENT   Triglycerides   Collection Time: 11/29/23  6:21 PM  Result Value Ref Range   Triglycerides 57 <150 mg/dL  Type and screen [facility]   Collection Time: 11/29/23  6:21 PM  Result Value Ref Range   ABO/RH(D) O POS    Antibody Screen POS    Sample Expiration 12/02/2023,2359    Antibody Identification ANTI LEA Angelica a)    Unit Number [id number]    Blood Component Type RBC LR PHER2    Unit division 00    Status of Unit  REL FROM [facility]    Transfusion Status OK TO TRANSFUSE    Crossmatch Result COMPATIBLE    Unit Number [id number]    Blood Component Type RED CELLS,LR    Unit division 00    Status of Unit REL FROM [facility]    Transfusion Status OK TO TRANSFUSE    Crossmatch Result COMPATIBLE   BPAM RBC   Collection Time: 11/29/23  6:21 PM  Result Value Ref Range   Blood Product Unit Number [id number]    PRODUCT CODE [id number]    Unit Type and Rh 5100    Blood Product Expiration Date 797487957640    ISSUE DATE / TIME [id number]    Blood Product Unit Number [id number]    PRODUCT CODE [id number]    Unit Type and Rh 5100    Blood Product Expiration Date 797487937640   Brain natriuretic peptide   Collection Time: 11/30/23  9:15 AM  Result Value Ref Range   B Natriuretic Peptide 73.6 0.0 - 100.0 pg/mL  Comprehensive metabolic panel   Collection Time: 11/30/23  9:15 AM  Result Value Ref Range   Sodium 134 (L) 135 - 145 mmol/L   Potassium 3.4 (L) 3.5 - 5.1 mmol/L   Chloride 101 98 - 111 mmol/L   CO2 22 22 - 32 mmol/L   Glucose, Bld 79 70 - 99  mg/dL   BUN <5 (L) 6 - 20 mg/dL   Creatinine, Ser 9.39 0.44 - 1.00 mg/dL   Calcium  8.4 (L) 8.9 - 10.3 mg/dL   Total Protein 6.4 (L) 6.5 - 8.1 g/dL   Albumin 2.7 (L) 3.5 - 5.0 g/dL   AST 14 (L) 15 - 41 U/L   ALT 16 0 - 44 U/L   Alkaline Phosphatase 50 38 - 126 U/L   Total Bilirubin 0.6 0.0 - 1.2 mg/dL   GFR, Estimated >39 >39 mL/min   Anion gap 11 5 - 15  CBC   Collection Time: 11/30/23  9:15 AM  Result Value Ref Range   WBC 8.3 4.0 - 10.5 K/uL   RBC 3.82 (L) 3.87 - 5.11 MIL/uL   Hemoglobin 11.6 (L) 12.0 - 15.0 g/dL   HCT 65.7 (L) 63.9 - 53.9 %   MCV 89.5 80.0 - 100.0 fL   MCH 30.4 26.0 - 34.0 pg   MCHC 33.9 30.0 - 36.0 g/dL   RDW 86.8 88.4 - 84.4 %   Platelets 330 150 - 400 K/uL   nRBC 0.0 0.0 - 0.2 %  Lipase, blood   Collection Time: 11/30/23  9:15 AM  Result Value Ref Range   Lipase 47 11 - 51 U/L  Troponin I (High Sensitivity)   Collection Time: 11/30/23  9:15 AM  Result Value Ref Range   Troponin I (High Sensitivity) 3 <18 ng/L  CBC   Collection Time: 12/01/23  4:34 AM  Result Value Ref Range   WBC 8.7 4.0 - 10.5 K/uL   RBC 3.43 (L) 3.87 - 5.11 MIL/uL   Hemoglobin 10.5 (L) 12.0 - 15.0 g/dL   HCT 68.9 (L) 63.9 - 53.9 %   MCV 90.4 80.0 - 100.0 fL   MCH 30.6 26.0 - 34.0 pg   MCHC 33.9 30.0 - 36.0 g/dL   RDW 86.7 88.4 - 84.4 %   Platelets 292 150 - 400 K/uL   nRBC 0.0 0.0 - 0.2 %  Comprehensive metabolic panel   Collection Time: 12/01/23  4:34 AM  Result Value Ref Range   Sodium 135 135 - 145  mmol/L   Potassium 3.4 (L) 3.5 - 5.1 mmol/L   Chloride 103 98 - 111 mmol/L   CO2 21 (L) 22 - 32 mmol/L   Glucose, Bld 76 70 - 99 mg/dL   BUN <5 (L) 6 - 20 mg/dL   Creatinine, Ser 9.40 0.44 - 1.00 mg/dL   Calcium  8.5 (L) 8.9 - 10.3 mg/dL   Total Protein 6.0 (L) 6.5 - 8.1 g/dL   Albumin 2.3 (L) 3.5 - 5.0 g/dL   AST 11 (L) 15 - 41 U/L   ALT 12 0 - 44 U/L   Alkaline Phosphatase 49 38 - 126 U/L   Total Bilirubin 0.7 0.0 - 1.2 mg/dL   GFR, Estimated >39 >39 mL/min    Anion gap 11 5 - 15  Lipase, blood   Collection Time: 12/01/23  4:34 AM  Result Value Ref Range   Lipase 33 11 - 51 U/L  Magnesium    Collection Time: 12/01/23  4:34 AM  Result Value Ref Range   Magnesium  1.9 1.7 - 2.4 mg/dL    Treatments: IV hydration and pain control  Hospital Course:  This is a 33 y.o. [id number] with IUP at [redacted]w[redacted]d who presented with abdominal pain. Found to have acute pancreatitis on MRI with mild elevation of lipase. No gallstones or biliary duct dilation seen on MRI, she denies alcohol use (denies history of heavy use as well), triglycerides are normal. Pancreatitis thus appears to be idiopathic. Symptomatically she is much improved and tolerating a diet. GI consulted, advises outpatient f/u (they will arrange), tentative plan for eventual MRCP. Will discharge to continue home zofran  (which she says is helpful) and given short course of oxycodone . Chronic hypertension stable oh home procardia, is established with our centering pregnancy group and will f/u there as scheduled.   Discharge Exam: BP (!) 106/53 (BP Location: Right Arm)   Pulse 83   Temp 98 F (36.7 C) (Oral)   Resp 16   Ht 5' 4 (1.626 m)   Wt 121.6 kg   LMP 08/07/2023 (Exact Date)   SpO2 95%   BMI 46.00 kg/m  NAD RRR CTAB Abdomen soft, mild tenderness epigastrum Ext warm, no edema  Discharge Condition: good  Disposition: Discharge disposition: 01-Home or Self Care       Discharge Instructions     Diet general   Complete by: As directed    Increase activity slowly   Complete by: As directed       Allergies as of 12/03/2023   No Known Allergies      Medication List     STOP taking these medications    amoxicillin  875 MG tablet Commonly known as: AMOXIL    fluticasone  50 MCG/ACT nasal spray Commonly known as: FLONASE    ibuprofen  600 MG tablet Commonly known as: ADVIL    magic mouthwash w/lidocaine  Soln   methocarbamol  500 MG tablet Commonly known as: ROBAXIN     misoprostol  200 MCG tablet Commonly known as: Cytotec    sertraline  50 MG tablet Commonly known as: ZOLOFT        TAKE these medications    acetaminophen  325 MG tablet Commonly known as: TYLENOL  Take 650 mg by mouth every 6 (six) hours as needed.   aspirin EC 81 MG tablet Take 1 tablet (81 mg total) by mouth daily.   Blood Pressure Monitoring Devi 1 each by Does not apply route once a week.   NIFEdipine 30 MG 24 hr tablet Commonly known as: Procardia XL Take 1 tablet (30 mg total) by  mouth daily.   ondansetron  4 MG disintegrating tablet Commonly known as: ZOFRAN -ODT Take 1 tablet (4 mg total) by mouth every 8 (eight) hours as needed for nausea.   oxyCODONE  5 MG immediate release tablet Commonly known as: Oxy IR/ROXICODONE  Take 1 tablet (5 mg total) by mouth every 6 (six) hours as needed for moderate pain (pain score 4-6) or severe pain (pain score 7-10).   prenatal multivitamin Tabs tablet Take 1 tablet by mouth daily at 12 noon.        Follow-up Information     Armbruster, Elspeth SQUIBB, MD Follow up.   Specialty: Gastroenterology Contact information: [address] [place] [address] [place] [address] [phone]                 Signed: Devaughn KATHEE Ban M.D. 12/03/2023, 9:11 AM

## 2023-12-14 ENCOUNTER — Telehealth: Payer: Self-pay | Admitting: *Deleted

## 2023-12-14 NOTE — Telephone Encounter (Signed)
 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 She is ready to return to work and would like a letter for her job in order to do so.

## 2023-12-16 NOTE — Telephone Encounter (Signed)
 Called pt to let her know a return to work letter should now be available in her MyChart.    [place], RN

## 2023-12-27 ENCOUNTER — Encounter: Payer: Self-pay | Admitting: Advanced Practice Midwife

## 2023-12-27 DIAGNOSIS — O361990 Maternal care for other isoimmunization, unspecified trimester, not applicable or unspecified: Secondary | ICD-10-CM | POA: Insufficient documentation

## 2023-12-27 DIAGNOSIS — A749 Chlamydial infection, unspecified: Secondary | ICD-10-CM | POA: Insufficient documentation

## 2023-12-27 DIAGNOSIS — O09899 Supervision of other high risk pregnancies, unspecified trimester: Secondary | ICD-10-CM | POA: Insufficient documentation

## 2023-12-27 NOTE — Progress Notes (Signed)
 PRENATAL VISIT NOTE- Centering Pregnancy Cycle 25, Session # 1  Subjective:  Christina Riggs is a 33 y.o. [phone] at 102w4d being seen today for ongoing prenatal care through [facility].  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  Patient reports no complaints.   . Vag. Bleeding: None.  Movement: Present. Denies leaking of fluid/ROM.   The following portions of the patient's history were reviewed and updated as appropriate: allergies, current medications, past family history, past medical history, past social history, past surgical history and problem list. Problem list updated.  Objective:   Vitals:   12/28/23 0959  BP: 139/83  Pulse: 94  Weight: 276 lb 3.2 oz (125.3 kg)    Fetal Status: Fetal Heart Rate (bpm): 140 Fundal Height: 21 cm Movement: Present     General:  Alert, oriented and cooperative. Patient is in no acute distress.  Skin: Skin is warm and dry. No rash noted.   Cardiovascular: Normal heart rate noted  Respiratory: Normal respiratory effort, no problems with respiration noted  Abdomen: Soft, gravid, appropriate for gestational age.  Pain/Pressure: Absent     Pelvic: Cervical exam deferred        Extremities: Normal range of motion.  Edema: Mild pitting, slight indentation  Mental Status: Normal mood and affect. Normal behavior. Normal judgment and thought content.   Assessment and Plan:  Pregnancy: [id number] at [redacted]w[redacted]d  1. Chronic hypertension (Primary) - US  MFM OB DETAIL +14 WK; Future  2. Idiopathic acute pancreatitis without infection  or necrosis  3. ASCUS of cervix with negative high risk HPV  4. Supervision of other normal pregnancy, antepartum - AFP, Serum, Open Spina Bifida; Future - US  MFM OB DETAIL +14 WK; Future  5. BMI 45.0-49.9, adult (HCC) - US  MFM OB DETAIL +14 WK; Future  6. [redacted] weeks gestation of pregnancy  7. 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 History of premature delivery, currently pregnant - US  MFM OB DETAIL +14 WK; Future  9. 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 It has not been reported as a cause of HDN. Titer not indicated.     Centering Pregnancy, Session#1: Introduction to model of care. Group determined rules for self-governance and closing phrase. Oriented group to space and mother's notebook.   Facilitated discussion today:  common discomforts, When to call practice  Mindfulness activity completed as well as introduction to deep breathing for childbirth preparation- Centering 3 Breaths  Fundal height and FHR appropriate today unless noted otherwise in plan of care. Patient to continue group care.    Preterm labor symptoms and general obstetric precautions including but not limited to vaginal bleeding, contractions, leaking of fluid and fetal movement were reviewed in detail with the patient. Please refer to After Visit Summary for other counseling recommendations.  No follow-ups on file.  Future Appointments  Date Time Provider Department Center  01/04/2024  10:40 AM [facility]  01/25/2024  9:00 AM CENTERING PROVIDER [facility]  01/29/2024 10:40 AM [place], [name] [facility]  02/22/2024  9:00 AM CENTERING PROVIDER  [facility]  03/07/2024  9:00 AM CENTERING PROVIDER [facility]  03/21/2024  9:00 AM CENTERING PROVIDER [facility]  04/04/2024  9:00 AM CENTERING PROVIDER [facility]  04/18/2024  9:00 AM CENTERING PROVIDER [facility]  05/02/2024  9:00 AM CENTERING PROVIDER [facility]  05/09/2024  9:00 AM CENTERING PROVIDER [facility]  05/16/2024  9:00 AM CENTERING PROVIDER [facility]    Massai Hankerson  Claudene, CNM

## 2023-12-27 NOTE — Assessment & Plan Note (Signed)
 See [facility] postpartum 2/2 pancreatitis @ 17w

## 2023-12-27 NOTE — Assessment & Plan Note (Signed)
 Will not cross the placenta. It has not been reported as a cause of HDN. Titer not indicated.

## 2023-12-27 NOTE — Assessment & Plan Note (Signed)
-   Procardia  30 XL - Antenatal testing per MFM - Nml Baseline labs

## 2023-12-28 ENCOUNTER — Other Ambulatory Visit: Payer: Self-pay

## 2023-12-28 ENCOUNTER — Ambulatory Visit

## 2023-12-28 ENCOUNTER — Other Ambulatory Visit (HOSPITAL_COMMUNITY)
Admission: RE | Admit: 2023-12-28 | Discharge: 2023-12-28 | Disposition: A | Source: Ambulatory Visit | Attending: Advanced Practice Midwife | Admitting: Advanced Practice Midwife

## 2023-12-28 VITALS — BP 139/83 | HR 94 | Wt 276.2 lb

## 2023-12-28 DIAGNOSIS — Z6841 Body Mass Index (BMI) 40.0 and over, adult: Secondary | ICD-10-CM | POA: Diagnosis not present

## 2023-12-28 DIAGNOSIS — A749 Chlamydial infection, unspecified: Secondary | ICD-10-CM

## 2023-12-28 DIAGNOSIS — R87610 Atypical squamous cells of undetermined significance on cytologic smear of cervix (ASC-US): Secondary | ICD-10-CM

## 2023-12-28 DIAGNOSIS — I10 Essential (primary) hypertension: Secondary | ICD-10-CM | POA: Diagnosis not present

## 2023-12-28 DIAGNOSIS — O98812 Other maternal infectious and parasitic diseases complicating pregnancy, second trimester: Secondary | ICD-10-CM

## 2023-12-28 DIAGNOSIS — Z3480 Encounter for supervision of other normal pregnancy, unspecified trimester: Secondary | ICD-10-CM

## 2023-12-28 DIAGNOSIS — Z3A21 21 weeks gestation of pregnancy: Secondary | ICD-10-CM | POA: Diagnosis not present

## 2023-12-28 DIAGNOSIS — K8500 Idiopathic acute pancreatitis without necrosis or infection: Secondary | ICD-10-CM | POA: Diagnosis not present

## 2023-12-28 DIAGNOSIS — O09892 Supervision of other high risk pregnancies, second trimester: Secondary | ICD-10-CM | POA: Diagnosis not present

## 2023-12-28 DIAGNOSIS — O361920 Maternal care for other isoimmunization, second trimester, not applicable or unspecified: Secondary | ICD-10-CM

## 2023-12-28 DIAGNOSIS — O09899 Supervision of other high risk pregnancies, unspecified trimester: Secondary | ICD-10-CM

## 2023-12-29 LAB — CERVICOVAGINAL ANCILLARY ONLY
Chlamydia: NEGATIVE
Comment: NEGATIVE
Comment: NEGATIVE
Comment: NORMAL
Neisseria Gonorrhea: NEGATIVE
Trichomonas: NEGATIVE

## 2023-12-31 ENCOUNTER — Encounter: Payer: Self-pay | Admitting: *Deleted

## 2024-01-04 ENCOUNTER — Other Ambulatory Visit: Payer: Self-pay

## 2024-01-06 ENCOUNTER — Other Ambulatory Visit: Payer: Self-pay

## 2024-01-06 DIAGNOSIS — Z3480 Encounter for supervision of other normal pregnancy, unspecified trimester: Secondary | ICD-10-CM

## 2024-01-07 ENCOUNTER — Ambulatory Visit: Payer: Self-pay | Admitting: Advanced Practice Midwife

## 2024-01-07 LAB — AFP, SERUM, OPEN SPINA BIFIDA
AFP MoM: 0.96
AFP Value: 60.7 ng/mL
Gest. Age on Collection Date: 22.9 wk
Maternal Age At EDD: 34.9 a
OSBR Risk 1 IN: 10000
Test Results:: NEGATIVE
Weight: 276 [lb_av]

## 2024-01-09 ENCOUNTER — Other Ambulatory Visit: Payer: Self-pay

## 2024-01-09 ENCOUNTER — Encounter (HOSPITAL_COMMUNITY): Payer: Self-pay | Admitting: Obstetrics and Gynecology

## 2024-01-09 ENCOUNTER — Inpatient Hospital Stay (HOSPITAL_COMMUNITY)
Admission: AD | Admit: 2024-01-09 | Discharge: 2024-01-09 | Disposition: A | Discharge status: Home / Self Care | Attending: Obstetrics and Gynecology | Admitting: Obstetrics and Gynecology

## 2024-01-09 DIAGNOSIS — O99612 Diseases of the digestive system complicating pregnancy, second trimester: Secondary | ICD-10-CM | POA: Insufficient documentation

## 2024-01-09 DIAGNOSIS — O10912 Unspecified pre-existing hypertension complicating pregnancy, second trimester: Secondary | ICD-10-CM | POA: Diagnosis not present

## 2024-01-09 DIAGNOSIS — Z7982 Long term (current) use of aspirin: Secondary | ICD-10-CM | POA: Diagnosis not present

## 2024-01-09 DIAGNOSIS — O26892 Other specified pregnancy related conditions, second trimester: Secondary | ICD-10-CM | POA: Diagnosis not present

## 2024-01-09 DIAGNOSIS — O4692 Antepartum hemorrhage, unspecified, second trimester: Secondary | ICD-10-CM | POA: Diagnosis present

## 2024-01-09 DIAGNOSIS — O26852 Spotting complicating pregnancy, second trimester: Secondary | ICD-10-CM | POA: Diagnosis not present

## 2024-01-09 DIAGNOSIS — N938 Other specified abnormal uterine and vaginal bleeding: Secondary | ICD-10-CM | POA: Insufficient documentation

## 2024-01-09 DIAGNOSIS — Z3A23 23 weeks gestation of pregnancy: Secondary | ICD-10-CM | POA: Diagnosis not present

## 2024-01-09 DIAGNOSIS — K5900 Constipation, unspecified: Secondary | ICD-10-CM | POA: Insufficient documentation

## 2024-01-09 DIAGNOSIS — Z87891 Personal history of nicotine dependence: Secondary | ICD-10-CM | POA: Insufficient documentation

## 2024-01-09 NOTE — MAU Note (Signed)
 Christina Riggs is a 33 y.o. at [redacted]w[redacted]d here in MAU reporting: vaginal spotting that has been on and off over the last 2-3 days. Saw light red/pink bleeding with wiping, has not needed a pad. Patient denies any unusual vaginal discharge, vaginal odor, itching or pain. Denies any recent sexual intercourse.  LLQ pain that comes and goes that can be worse with movements and sometimes it is random. Reports feeling fetal movements. Denies any CTXS or LOF. Has not taken any medication for cramping today.   Onset of complaint: ongoing  Pain score: 7 Vitals:   01/09/24 1523  BP: (!) 142/78  Pulse: 87  Resp: 17  Temp: 97.9 F (36.6 C)  SpO2: 99%     FHT:145 Lab orders placed from triage:  UA

## 2024-01-09 NOTE — MAU Provider Note (Signed)
 " History     CSN: [id number]  Arrival date and time: 01/09/24 1508   Chief Complaint  Patient presents with   Vaginal Bleeding   HPI [name] is a 33 y.o. year old [id number] female at [redacted]w[redacted]d weeks gestation who presents to MAU reporting scant intermittent vaginal spotting times a couple of days.  She describes the color as light pink with wiping only.  She has not had to wear a pad at any time.  She denies any recent sexual intercourse.  She also describes a left lower quadrant pain that is intermittent and increases with movements and completely random.  She reports positive fetal movement.  She has been constipated and has had to strain for bowel movements.  She receives prenatal care at [facility]; next appointment 01/28/2024. Her brother is present and contributing to the history taking.    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   01/08/2024   aspirin  EC 81 MG tablet Take 1 tablet (81 mg total) by mouth daily. 90 tablet 3 01/09/2024   NIFEdipine  (PROCARDIA  XL) 30 MG 24 hr tablet Take 1 tablet (30 mg total) by  mouth daily. 30 tablet 6 01/08/2024   ondansetron  (ZOFRAN -ODT) 4 MG disintegrating tablet Take 1 tablet (4 mg total) by mouth every 8 (eight) hours as needed for nausea. 42 tablet 2 01/08/2024   Prenatal Vit-Fe Fumarate-FA (PRENATAL MULTIVITAMIN) TABS tablet Take 1 tablet by mouth daily at 12 noon.   01/08/2024   Blood Pressure Monitoring DEVI 1 each by Does not apply route once a week. 1 each 0    oxyCODONE  (OXY IR/ROXICODONE ) 5 MG immediate release tablet Take 1 tablet (5 mg total) by mouth every 6 (six) hours as needed for moderate pain (pain score 4-6) or severe pain (pain score 7-10). 10 tablet 0 Unknown    Review of Systems  Constitutional: Negative.   HENT: Negative.    Eyes: Negative.   Respiratory: Negative.    Cardiovascular: Negative.   Gastrointestinal: Negative.   Endocrine: Negative.   Genitourinary:  Positive for pelvic pain (lower LT intermittent cramping, increases with movements; randomly) and vaginal bleeding (spotting for a couple of days).  Musculoskeletal: Negative.   Skin: Negative.   Allergic/Immunologic: Negative.   Neurological: Negative.   Hematological:  Negative.   Psychiatric/Behavioral: Negative.     Physical Exam   Blood pressure 128/75, pulse 84, temperature 97.9 F (36.6 C), temperature source Oral, resp. rate 17, height 5' 4 (1.626 m), weight 126 kg, last menstrual period 08/07/2023, SpO2 99%.  Physical Exam Vitals and nursing note reviewed.  Constitutional:      Appearance: Normal appearance. She is obese.  Cardiovascular:     Rate and Rhythm: Normal rate.  Pulmonary:     Effort: Pulmonary effort is normal.  Abdominal:     Palpations: Abdomen is soft.  Musculoskeletal:        General: Normal range of motion.  Skin:    General: Skin is warm and dry.  Neurological:     Mental Status: She is alert and oriented to person, place, and time.  Psychiatric:        Mood and Affect: Mood normal.        Behavior: Behavior normal.        Thought  Content: Thought content normal.        Judgment: Judgment normal.    REACTIVE NST - FHR: 145 bpm / moderate variability / accels present / decels absent / TOCO: none MAU Course  Procedures  MDM EFM  Assessment and Plan  1. Spotting affecting pregnancy in second trimester (Primary) - Return to MAU: If you have heavier bleeding that soaks through more that 2 pads per hour for an hour or more If you bleed so much that you feel like you might pass out or you do pass out If you have significant abdominal pain that is not improved with Tylenol  1000 mg every 8 hours as needed for pain If you develop a fever > 100.5   2. [redacted] weeks gestation of pregnancy   - Discharge home - Keep scheduled appt with [facility] on 01/28/2024 - Patient verbalized an understanding of the plan of care and agrees.     Ala Cart, CNM 01/09/2024, 6:11 PM     [1]  Social History Tobacco Use   Smoking status: Former    Types: Cigars   Smokeless tobacco: Never   Tobacco comments:    1  day  Vaping Use   Vaping status: Former  Substance Use Topics   Alcohol use: Not Currently   Drug use: No  [2]  Allergies Allergen Reactions   Latex Hives   "

## 2024-01-09 NOTE — Discharge Instructions (Signed)
 Return to MAU: If you have heavier bleeding that soaks through more that 2 pads per hour for an hour or more If you bleed so much that you feel like you might pass out or you do pass out If you have significant abdominal pain that is not improved with Tylenol 1000 mg every 8 hours as needed for pain If you develop a fever > 100.5

## 2024-01-11 ENCOUNTER — Encounter: Payer: Self-pay | Admitting: General Practice

## 2024-01-25 ENCOUNTER — Encounter: Payer: Self-pay | Admitting: Advanced Practice Midwife

## 2024-01-28 NOTE — Progress Notes (Signed)
 "     Christina Console, [name] [address] [place], [place]  [address] Phone: [phone]   Gastroenterology Consultation  Referring Provider:     No ref. provider found Primary Care Physician:  Patient, No Pcp Per Primary Gastroenterologist:  Christina Console, [name] / Elspeth Naval, MD  Reason for Consultation:     Hospital follow-up pancreatitis        HPI:   Discussed the use of AI scribe software for clinical note transcription with the patient, who gave verbal consent to proceed.  34 year old female, currently [redacted] weeks pregnant, was admitted to [facility] 11/29/2023 until 12/03/2023 for acute pancreatitis.  Elevated lipase 111.  Normal triglyceride 57.  Normal LFTs.  Mild anemia with Hgb 11.6, MCV 89.  Normal white count.  Lipase improved to normal 47.  Initially presented with upper abdominal pain.  Abdominal MRI showed acute pancreatitis.  No gallstones or bile duct dilation.  Denies alcohol, tobacco, or marijuana use.  Found to have idiopathic pancreatitis.  Symptoms improved with conservative treatment.   She has chronic hypertension and is on nifedipine .  History of Present Illness Christina Riggs is a 34 year old female, currently [redacted] weeks pregnant, who presents for follow-up after a recent hospitalization for acute pancreatitis.  Abdominal Pain: - Recent hospitalization for acute pancreatitis with severe upper abdominal pain during admission - Since discharge, epigastric and LUQ pain have resolved.  She still has occasional left lateral side abdominal pain. - Current pain is primarily on the left lateral side and occasionally higher in the abdomen - Pain is less severe than during hospitalization  Gastrointestinal Symptoms: - Persistent nausea throughout pregnancy - Frequent nausea continues since discharge - One episode of vomiting yesterday  Pregnancy Status: - Currently [redacted] weeks pregnant with her fourth child  Risk Factors and Relevant History: - No prior history  of pancreatitis - MRI during hospitalization showed no gallstones - Triglyceride levels were normal - No use of alcohol, cigarettes, or marijuana - No family history of pancreatitis - Currently taking nifedipine  for blood pressure - Not taking any medications known to cause pancreatitis, including hydrochlorothiazide  11/29/2023 RUQ ultrasound: Normal gallbladder.  No gallstones.  CBD 5.4 mm.  Normal liver.  Normal pancreas.  11/30/2023 abdominal pelvic MRI without contrast: 1. Normal appendix. 2. Acute pancreatitis, without drainable fluid collection or walled-off necrosis. 3. Moderate right hydroureteronephrosis, likely due to extrinsic compression from the gravid uterus, without visualized ureteral filling defect/calculus. 4.  Normal liver and gallbladder.  No gallstones or bile duct dilation.   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 Authorizing Provider  acetaminophen  (TYLENOL ) 325 MG tablet Take 650 mg by mouth every 6 (six) hours as needed.    [provider]  aspirin  EC 81 MG tablet Take 1 tablet (81 mg total) by  mouth daily. 11/17/23   Warren-Hill, Camie LABOR, CNM  Blood Pressure Monitoring DEVI 1 each by Does not apply route once a week. 11/04/23   Warren-Hill, Camie LABOR, CNM  NIFEdipine  (PROCARDIA  XL) 30 MG 24 hr tablet Take 1 tablet (30 mg total) by mouth daily. 11/17/23   Warren-Hill, Camie LABOR, CNM   ondansetron  (ZOFRAN -ODT) 4 MG disintegrating tablet Take 1 tablet (4 mg total) by mouth every 8 (eight) hours as needed for nausea. 11/17/23   Warren-Hill, Camie LABOR, CNM  oxyCODONE  (OXY IR/ROXICODONE ) 5 MG immediate release tablet Take 1 tablet (5 mg total) by mouth every 6 (six) hours as needed for moderate pain (pain score 4-6) or severe pain (pain score 7-10). 12/03/23   Wouk, Devaughn Sayres, MD  Prenatal Vit-Fe Fumarate-FA (PRENATAL MULTIVITAMIN) TABS tablet Take 1 tablet by mouth daily at 12 noon.    [provider]    Family History  Problem Relation Age of Onset   Hypertension Father    Stroke Father      Social History[1]  Allergies as of 01/29/2024 - Review Complete 01/29/2024  Allergen Reaction Noted   Latex Hives 01/09/2024    Review of Systems:    All systems reviewed and negative except where noted in HPI.   Physical Exam:  BP 118/72   Pulse 86   Ht 5' 4 (1.626 m)   Wt 280 lb (127 kg)   LMP 08/07/2023 (Exact Date)   SpO2 98%   BMI 48.06 kg/m  Patient's last menstrual period was 08/07/2023 (exact date).  General:   Alert,  Well-developed, well-nourished, pleasant and cooperative in NAD Lungs:  Respirations even and unlabored.  Clear throughout to auscultation.   No wheezes, crackles, or rhonchi. No acute distress. Heart:  Regular rate and rhythm; no murmurs, clicks, rubs, or gallops. Abdomen:  Normal bowel sounds.  No bruits.  Soft, and non-distended without masses, hepatosplenomegaly or hernias noted.  No Tenderness.  No guarding or rebound tenderness.    Neurologic:  Alert and oriented x3;  grossly normal neurologically. Psych:  Alert and cooperative. Normal mood and affect.   Imaging Studies: No results found.  Labs: CBC    Component Value Date/Time   WBC 8.7 12/01/2023 0434   RBC 3.43 (L) 12/01/2023 0434   HGB 10.5 (L) 12/01/2023 0434   HGB 12.3 11/17/2023 0959   HCT 31.0 (L) 12/01/2023 0434   HCT 37.4 11/17/2023 0959   PLT 292 12/01/2023  0434   PLT 367 11/17/2023 0959   MCV 90.4 12/01/2023 0434   MCV 92 11/17/2023 0959    CMP     Component Value Date/Time   NA 135 12/01/2023 0434   NA 135 11/17/2023 0959   K 3.4 (L) 12/01/2023 0434   CL 103 12/01/2023 0434   CO2 21 (L) 12/01/2023 0434   GLUCOSE 76 12/01/2023 0434   BUN <5 (L) 12/01/2023 0434   BUN 5 (L) 11/17/2023 0959   CREATININE 0.59 12/01/2023 0434   CALCIUM  8.5 (L) 12/01/2023 0434   PROT 6.0 (L) 12/01/2023 0434   PROT 6.8 11/17/2023 0959   ALBUMIN 2.3 (L) 12/01/2023 0434   ALBUMIN 3.9 11/17/2023 0959   AST 11 (L) 12/01/2023 0434   ALT 12 12/01/2023 0434   ALKPHOS 49 12/01/2023 0434   BILITOT 0.7 12/01/2023 0434   BILITOT 0.3 11/17/2023 0959   GFRNONAA >60 12/01/2023 0434   GFRAA >60 08/03/2018 1039    Assessment and Plan:   Iman Riggs is a 34 y.o.  y/o female has been referred for:  1.  Acute idiopathic pancreatitis Uncertain etiology.  Recent triglycerides normal.  No alcohol use.  Recent abdominal MRI showed no gallstones.  Risk factor for her pancreatitis includes obesity (BMI 48).  Upper abdominal pain currently improved. - Lab: Lipase, IgG4 - Continue to avoid alcohol and tobacco. - If she has recurrent upper abdominal pain, then recommend abdominal MRI/MRCP. - She is encouraged to drink at least 64 ounces of water daily. - Recommend low-fat diet and weight loss.  2.  Current pregnancy: 26 weeks - Continue follow-up with OB - Discussed gallstone risk and importance of low-fat diet.  3.  Hypertension: Controlled on Nifedipine  - Continue current medication - I gave patient a list of medications to avoid which could cause pancreatitis - Nifedipine  has not been established as a cause of pancreatitis  4.  Obesity - Encouraged low fat diet, weight loss  Follow up based on lab results and GI symptoms.  Follow-up if she has recurrent upper abdominal pain or pancreatitis.  Christina Console, [name]       [1]  Social  History Tobacco Use   Smoking status: Former    Types: Cigars   Smokeless tobacco: Never   Tobacco comments:    1  day  Vaping Use   Vaping status: Former  Substance Use Topics   Alcohol use: Not Currently   Drug use: No   "

## 2024-01-29 ENCOUNTER — Ambulatory Visit: Admitting: Physician Assistant

## 2024-01-29 ENCOUNTER — Encounter: Payer: Self-pay | Admitting: Physician Assistant

## 2024-01-29 ENCOUNTER — Other Ambulatory Visit

## 2024-01-29 VITALS — BP 118/72 | HR 86 | Ht 64.0 in | Wt 280.0 lb

## 2024-01-29 DIAGNOSIS — Z3A26 26 weeks gestation of pregnancy: Secondary | ICD-10-CM

## 2024-01-29 DIAGNOSIS — I10 Essential (primary) hypertension: Secondary | ICD-10-CM | POA: Diagnosis not present

## 2024-01-29 DIAGNOSIS — E669 Obesity, unspecified: Secondary | ICD-10-CM | POA: Diagnosis not present

## 2024-01-29 DIAGNOSIS — K8500 Idiopathic acute pancreatitis without necrosis or infection: Secondary | ICD-10-CM

## 2024-01-29 DIAGNOSIS — O0001 Abdominal pregnancy with intrauterine pregnancy: Secondary | ICD-10-CM | POA: Diagnosis not present

## 2024-01-29 LAB — LIPASE: Lipase: 11 U/L (ref 11.0–59.0)

## 2024-01-29 NOTE — Progress Notes (Signed)
 Agree with assessment plan as outlined.  I recommend that she have follow-up imaging of her pancreas to make sure no other abnormalities in her pancreas that could have predisposed her to this.  Given she is currently pregnant and will deliver in the next few months, I recommend doing MRCP when she has recovered from her pregnancy, perhaps 1 to 2 months post partum.  Christina Riggs can you let the patient know or have the staff do that and place a recall in the system for MRCP.  Thanks

## 2024-01-29 NOTE — Patient Instructions (Signed)
Your provider has requested that you go to the basement level for lab work before leaving today. Press "B" on the elevator. The lab is located at the first door on the left as you exit the elevator.  

## 2024-01-30 LAB — IGG 4: IgG, Subclass 4: 46 mg/dL (ref 2–96)

## 2024-02-01 ENCOUNTER — Ambulatory Visit: Payer: Self-pay | Admitting: Physician Assistant

## 2024-02-05 ENCOUNTER — Encounter (HOSPITAL_COMMUNITY): Payer: Self-pay | Admitting: Obstetrics and Gynecology

## 2024-02-05 ENCOUNTER — Other Ambulatory Visit: Payer: Self-pay

## 2024-02-05 ENCOUNTER — Inpatient Hospital Stay (HOSPITAL_COMMUNITY)
Admission: AD | Admit: 2024-02-05 | Discharge: 2024-02-05 | Disposition: A | Discharge status: Home / Self Care | Attending: Obstetrics and Gynecology | Admitting: Obstetrics and Gynecology

## 2024-02-05 DIAGNOSIS — O212 Late vomiting of pregnancy: Secondary | ICD-10-CM | POA: Diagnosis present

## 2024-02-05 DIAGNOSIS — O219 Vomiting of pregnancy, unspecified: Secondary | ICD-10-CM | POA: Diagnosis not present

## 2024-02-05 DIAGNOSIS — Z91040 Latex allergy status: Secondary | ICD-10-CM | POA: Diagnosis not present

## 2024-02-05 DIAGNOSIS — O26893 Other specified pregnancy related conditions, third trimester: Secondary | ICD-10-CM | POA: Diagnosis not present

## 2024-02-05 DIAGNOSIS — N949 Unspecified condition associated with female genital organs and menstrual cycle: Secondary | ICD-10-CM

## 2024-02-05 DIAGNOSIS — O2343 Unspecified infection of urinary tract in pregnancy, third trimester: Secondary | ICD-10-CM | POA: Insufficient documentation

## 2024-02-05 DIAGNOSIS — R102 Pelvic and perineal pain unspecified side: Secondary | ICD-10-CM | POA: Insufficient documentation

## 2024-02-05 DIAGNOSIS — Z3689 Encounter for other specified antenatal screening: Secondary | ICD-10-CM | POA: Insufficient documentation

## 2024-02-05 DIAGNOSIS — Z3480 Encounter for supervision of other normal pregnancy, unspecified trimester: Secondary | ICD-10-CM

## 2024-02-05 DIAGNOSIS — Z3A27 27 weeks gestation of pregnancy: Secondary | ICD-10-CM | POA: Insufficient documentation

## 2024-02-05 LAB — CBC WITH DIFFERENTIAL/PLATELET
Abs Immature Granulocytes: 0.02 K/uL (ref 0.00–0.07)
Basophils Absolute: 0 K/uL (ref 0.0–0.1)
Basophils Relative: 0 %
Eosinophils Absolute: 0 K/uL (ref 0.0–0.5)
Eosinophils Relative: 1 %
HCT: 30.6 % — ABNORMAL LOW (ref 36.0–46.0)
Hemoglobin: 10.6 g/dL — ABNORMAL LOW (ref 12.0–15.0)
Immature Granulocytes: 0 %
Lymphocytes Relative: 37 %
Lymphs Abs: 2.3 K/uL (ref 0.7–4.0)
MCH: 32 pg (ref 26.0–34.0)
MCHC: 34.6 g/dL (ref 30.0–36.0)
MCV: 92.4 fL (ref 80.0–100.0)
Monocytes Absolute: 0.3 K/uL (ref 0.1–1.0)
Monocytes Relative: 4 %
Neutro Abs: 3.6 K/uL (ref 1.7–7.7)
Neutrophils Relative %: 58 %
Platelets: 310 K/uL (ref 150–400)
RBC: 3.31 MIL/uL — ABNORMAL LOW (ref 3.87–5.11)
RDW: 13.8 % (ref 11.5–15.5)
WBC: 6.3 K/uL (ref 4.0–10.5)
nRBC: 0 % (ref 0.0–0.2)

## 2024-02-05 LAB — URINALYSIS, ROUTINE W REFLEX MICROSCOPIC
Bilirubin Urine: NEGATIVE
Glucose, UA: NEGATIVE mg/dL
Ketones, ur: NEGATIVE mg/dL
Nitrite: NEGATIVE
Protein, ur: NEGATIVE mg/dL
Specific Gravity, Urine: 1.002 — ABNORMAL LOW (ref 1.005–1.030)
pH: 7 (ref 5.0–8.0)

## 2024-02-05 LAB — COMPREHENSIVE METABOLIC PANEL WITH GFR
ALT: 8 U/L (ref 0–44)
AST: 12 U/L — ABNORMAL LOW (ref 15–41)
Albumin: 3.4 g/dL — ABNORMAL LOW (ref 3.5–5.0)
Alkaline Phosphatase: 78 U/L (ref 38–126)
Anion gap: 11 (ref 5–15)
BUN: <5 mg/dL — ABNORMAL LOW (ref 6–20)
CO2: 22 mmol/L (ref 22–32)
Calcium: 8.9 mg/dL (ref 8.9–10.3)
Chloride: 100 mmol/L (ref 98–111)
Creatinine, Ser: 0.54 mg/dL (ref 0.44–1.00)
GFR, Estimated: >60 mL/min
Glucose, Bld: 88 mg/dL (ref 70–99)
Potassium: 3.7 mmol/L (ref 3.5–5.1)
Sodium: 133 mmol/L — ABNORMAL LOW (ref 135–145)
Total Bilirubin: 0.2 mg/dL (ref 0.0–1.2)
Total Protein: 6.8 g/dL (ref 6.5–8.1)

## 2024-02-05 MED ORDER — ONDANSETRON HCL 4 MG/2ML IJ SOLN
4.0000 mg | Freq: Once | INTRAMUSCULAR | Status: AC
  Administered 2024-02-05: 4 mg via INTRAVENOUS
  Filled 2024-02-05: qty 2

## 2024-02-05 MED ORDER — SCOPOLAMINE 1 MG/3DAYS TD PT72
1.0000 | MEDICATED_PATCH | TRANSDERMAL | Status: DC
  Administered 2024-02-05: 1 mg via TRANSDERMAL
  Filled 2024-02-05: qty 1

## 2024-02-05 MED ORDER — LACTATED RINGERS IV BOLUS
1000.0000 mL | Freq: Once | INTRAVENOUS | Status: AC
  Administered 2024-02-05: 1000 mL via INTRAVENOUS

## 2024-02-05 MED ORDER — CEFADROXIL 500 MG PO CAPS: 500.0000 mg | ORAL_CAPSULE | Freq: Two times a day (BID) | ORAL | 0 refills | Status: AC

## 2024-02-05 NOTE — MAU Provider Note (Signed)
 Chief Complaint:  Emesis, Nose Bleed, and Nausea  HPI   Event Date/Time   First Provider Initiated Contact with Patient 02/05/24 1624     Christina Riggs is a 34 y.o. [phone] at [redacted]w[redacted]d who presents to maternity admissions reporting severe nausea today with two episodes of vomiting, both caused a mild nose bleed that resolved without intervention. Has some lower abdominal cramping right after vomiting, but is low and bilateral on the sides, not midline. Has some dysuria and frequency but no other concurrent symptoms. Denies vaginal bleeding, discharge, LOF or decreased fetal movement.  Pregnancy Course: Receives care at [facility], has a history of pancreatitis. Prenatal records reviewed.  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 Please review and update pregnancy details.   Past Surgical History:  Procedure Laterality Date   NO PAST SURGERIES     Family History  Problem Relation Age of Onset   Hypertension Father    Stroke Father    Social History[1] Allergies[2] Medications Prior to Admission  Medication Sig Dispense Refill Last Dose/Taking   aspirin  EC 81 MG tablet Take 1 tablet (81 mg total) by mouth daily. 90 tablet 3 02/05/2024   NIFEdipine   (PROCARDIA  XL) 30 MG 24 hr tablet Take 1 tablet (30 mg total) by mouth daily. 30 tablet 6 02/05/2024   ondansetron  (ZOFRAN -ODT) 4 MG disintegrating tablet Take 1 tablet (4 mg total) by mouth every 8 (eight) hours as needed for nausea. 42 tablet 2 02/05/2024   Prenatal Vit-Fe Fumarate-FA (PRENATAL MULTIVITAMIN) TABS tablet Take 1 tablet by mouth daily at 12 noon.   02/05/2024   acetaminophen  (TYLENOL ) 325 MG tablet Take 650 mg by mouth every 6 (six) hours as needed.   02/03/2024   Blood Pressure Monitoring DEVI 1 each by Does not apply route once a week. 1 each 0    oxyCODONE  (OXY IR/ROXICODONE ) 5 MG immediate release tablet Take 1 tablet (5 mg total) by mouth every 6 (six) hours as needed for moderate pain (pain score 4-6) or severe pain (pain score 7-10). 10 tablet 0    I have reviewed patient's Past Medical Hx, Surgical Hx, Family Hx, Social Hx, medications and allergies.   ROS  Pertinent items noted in HPI and remainder of comprehensive ROS otherwise negative.   PHYSICAL EXAM  Patient Vitals for the past 24 hrs:  BP Temp Temp src Pulse Resp SpO2 Height Weight  02/05/24 1559 135/83 98.3 F (36.8 C) Oral 90 20 98 % -- --  02/05/24 1533 120/76 98.6 F (37 C) Oral 88 17 97 % -- --  02/05/24 1528 -- -- -- -- -- -- 5' 4 (1.626 m) 282 lb 8 oz (128.1 kg)   Constitutional: Well-developed, well-nourished female in no acute distress.  Cardiovascular: normal rate & rhythm, warm and well-perfused Respiratory: normal effort, no problems with respiration noted GI: Abd soft, non-tender, non-distended, gravid MS: Extremities nontender, no edema, normal ROM Neurologic: Alert and oriented x 4.  GU: no CVA tenderness Pelvic: exam deferred  Fetal Tracing: reactive appropriate for gestational age Baseline: 140 Variability: moderate Accelerations: 15x15 Decelerations: occasional variable, appropriate for gestational age Toco: none   Labs: Results for orders placed or performed during the hospital  encounter of 02/05/24 (from the past 24 hours)  Urinalysis, Routine w reflex microscopic -Urine, Clean Catch     Status: Abnormal   Collection Time: 02/05/24  3:13 PM  Result Value Ref Range   Color, Urine YELLOW YELLOW   APPearance HAZY (A) CLEAR   Specific Gravity, Urine 1.002 (L) 1.005 - 1.030   pH 7.0 5.0 - 8.0   Glucose, UA NEGATIVE NEGATIVE mg/dL   Hgb urine dipstick SMALL (A) NEGATIVE   Bilirubin Urine NEGATIVE NEGATIVE   Ketones, ur NEGATIVE NEGATIVE mg/dL   Protein, ur NEGATIVE NEGATIVE mg/dL   Nitrite NEGATIVE NEGATIVE   Leukocytes,Ua MODERATE (A) NEGATIVE   RBC / HPF 0-5 0 - 5 RBC/hpf   WBC, UA 0-5 0 - 5 WBC/hpf   Bacteria, UA RARE (A) NONE SEEN   Squamous Epithelial / HPF 0-5 0 - 5 /HPF  CBC with Differential/Platelet     Status: Abnormal   Collection Time: 02/05/24  4:40 PM  Result Value Ref Range   WBC 6.3 4.0 - 10.5 K/uL   RBC 3.31 (L) 3.87 - 5.11 MIL/uL   Hemoglobin 10.6 (L) 12.0 - 15.0 g/dL   HCT 69.3 (L) 63.9 - 53.9 %   MCV 92.4 80.0 - 100.0 fL   MCH 32.0 26.0 - 34.0 pg   MCHC 34.6 30.0 - 36.0 g/dL   RDW 86.1 88.4 - 84.4 %   Platelets 310 150 - 400 K/uL   nRBC 0.0 0.0 - 0.2 %   Neutrophils Relative % 58 %   Neutro Abs 3.6 1.7 - 7.7 K/uL   Lymphocytes Relative 37 %   Lymphs Abs 2.3 0.7 - 4.0 K/uL   Monocytes Relative 4 %   Monocytes Absolute 0.3 0.1 - 1.0 K/uL   Eosinophils Relative 1 %   Eosinophils Absolute 0.0 0.0 - 0.5 K/uL   Basophils Relative 0 %   Basophils Absolute 0.0 0.0 - 0.1 K/uL   Immature Granulocytes 0 %   Abs Immature Granulocytes 0.02 0.00 - 0.07 K/uL  Comprehensive metabolic panel     Status: Abnormal   Collection Time: 02/05/24  4:40 PM  Result Value Ref Range   Sodium 133 (L) 135 - 145 mmol/L   Potassium 3.7 3.5 - 5.1 mmol/L   Chloride 100 98 - 111 mmol/L   CO2 22 22 - 32 mmol/L   Glucose, Bld 88 70 - 99 mg/dL   BUN <5 (L) 6 - 20 mg/dL   Creatinine, Ser 9.45 0.44 - 1.00 mg/dL   Calcium  8.9 8.9 - 10.3 mg/dL   Total Protein 6.8  6.5 - 8.1 g/dL   Albumin 3.4 (L) 3.5 - 5.0 g/dL   AST 12 (L) 15 - 41 U/L   ALT 8 0 - 44 U/L   Alkaline  Phosphatase 78 38 - 126 U/L   Total Bilirubin 0.2 0.0 - 1.2 mg/dL   GFR, Estimated >39 >39 mL/min   Anion gap 11 5 - 15   Imaging:  No results found.  MDM & MAU COURSE  MDM:  MAU Course: Orders Placed This Encounter  Procedures   Culture, OB Urine   Urinalysis, Routine w reflex microscopic -Urine, Clean Catch   CBC with Differential/Platelet   Comprehensive metabolic panel   Discharge patient   Meds ordered this encounter  Medications   lactated ringers  bolus 1,000 mL   ondansetron  (ZOFRAN ) injection 4 mg   scopolamine  (TRANSDERM-SCOP) 1 MG/3DAYS 1 mg   cefadroxil  (DURICEF) 500 MG capsule    Sig: Take 1 capsule (500 mg total) by mouth 2 (two) times daily for 5 days.    Dispense:  10 capsule    Refill:  0   LR bolus and zofran  ordered for nausea, mostly relieved but ready to go home so added scope patch UA with dysuria, will treat for UTI and added OB urine culture Stable for discharge home with PTL/UTI precautions  ASSESSMENT   1. Nausea and vomiting during pregnancy   2. Supervision of other normal pregnancy, antepartum   3. Urinary tract infection in mother during third trimester of pregnancy   4. Round ligament pain   5. NST (non-stress test) reactive   6. [redacted] weeks gestation of pregnancy    PLAN  Discharge home in stable condition with return precautions.     Follow-up Information     [facility] Follow up.   Specialty: Obstetrics and Gynecology Why: as scheduled for ongoing prenatal care Contact information: [address] [place]  [address] [phone]                Allergies as of 02/05/2024       Reactions   Latex Hives        Medication List     STOP taking these medications    oxyCODONE  5 MG immediate release tablet Commonly known as: Oxy IR/ROXICODONE         TAKE these medications    acetaminophen  325 MG tablet Commonly known as: TYLENOL  Take 650 mg by mouth every 6 (six) hours as needed.   aspirin  EC 81 MG tablet Take 1 tablet (81 mg total) by mouth daily.   Blood Pressure Monitoring Devi 1 each by Does not apply route once a week.   cefadroxil  500 MG capsule Commonly known as: DURICEF Take 1 capsule (500 mg total) by mouth 2 (two) times daily for 5 days.   NIFEdipine  30 MG 24 hr tablet Commonly known as: Procardia  XL Take 1 tablet (30 mg total) by mouth daily.   ondansetron  4 MG disintegrating tablet Commonly known as: ZOFRAN -ODT Take 1 tablet (4 mg total) by mouth every 8 (eight) hours as needed for nausea.   prenatal multivitamin Tabs tablet Take 1 tablet by mouth daily at 12 noon.        Cornell Finder, CNM, MSN, IBCLC Certified Nurse Midwife, [facility]        [1]  Social History Tobacco Use   Smoking status: Former    Types: Cigars   Smokeless tobacco: Never   Tobacco comments:    1  day  Vaping Use   Vaping status: Former  Substance Use Topics   Alcohol use: Not Currently   Drug use: No  [2]  Allergies Allergen  Reactions   Latex Hives

## 2024-02-05 NOTE — MAU Note (Signed)
 Christina Riggs is a 34 y.o. at [redacted]w[redacted]d here in MAU reporting: she's been nauseated all day and vomited twice.  States she also has had a nose bleed x2, states has occurred when vomited.  Denies VB and LOF.  Reports +FM.  LMP: 08/07/2023 Onset of complaint: today Pain score: 8 abdomen lower sharp Vitals:   02/05/24 1533  BP: 120/76  Pulse: 88  Resp: 17  Temp: 98.6 F (37 C)  SpO2: 97%     FHT: 149 bpm  Lab orders placed from triage: UA

## 2024-02-07 LAB — CULTURE, OB URINE

## 2024-02-17 ENCOUNTER — Encounter: Payer: Self-pay | Admitting: *Deleted

## 2024-02-23 ENCOUNTER — Ambulatory Visit: Admitting: Student

## 2024-02-23 ENCOUNTER — Other Ambulatory Visit: Payer: Self-pay

## 2024-02-23 ENCOUNTER — Encounter: Payer: Self-pay | Admitting: Student

## 2024-02-23 VITALS — BP 121/74 | HR 78 | Wt 286.1 lb

## 2024-02-23 DIAGNOSIS — O10913 Unspecified pre-existing hypertension complicating pregnancy, third trimester: Secondary | ICD-10-CM | POA: Diagnosis not present

## 2024-02-23 DIAGNOSIS — Z3A29 29 weeks gestation of pregnancy: Secondary | ICD-10-CM

## 2024-02-23 DIAGNOSIS — K8500 Idiopathic acute pancreatitis without necrosis or infection: Secondary | ICD-10-CM

## 2024-02-23 DIAGNOSIS — A749 Chlamydial infection, unspecified: Secondary | ICD-10-CM

## 2024-02-23 DIAGNOSIS — O219 Vomiting of pregnancy, unspecified: Secondary | ICD-10-CM

## 2024-02-23 DIAGNOSIS — O26893 Other specified pregnancy related conditions, third trimester: Secondary | ICD-10-CM | POA: Diagnosis not present

## 2024-02-23 DIAGNOSIS — R519 Headache, unspecified: Secondary | ICD-10-CM | POA: Diagnosis not present

## 2024-02-23 DIAGNOSIS — Z3480 Encounter for supervision of other normal pregnancy, unspecified trimester: Secondary | ICD-10-CM

## 2024-02-23 DIAGNOSIS — O98813 Other maternal infectious and parasitic diseases complicating pregnancy, third trimester: Secondary | ICD-10-CM

## 2024-02-23 DIAGNOSIS — O10919 Unspecified pre-existing hypertension complicating pregnancy, unspecified trimester: Secondary | ICD-10-CM

## 2024-02-23 DIAGNOSIS — Z23 Encounter for immunization: Secondary | ICD-10-CM | POA: Diagnosis not present

## 2024-02-23 MED ORDER — FAMOTIDINE 20 MG PO TABS: 20.0000 mg | ORAL_TABLET | Freq: Two times a day (BID) | ORAL | 1 refills | Status: AC

## 2024-02-23 MED ORDER — CYCLOBENZAPRINE HCL 10 MG PO TABS: 10.0000 mg | ORAL_TABLET | Freq: Three times a day (TID) | ORAL | 1 refills | Status: AC | PRN

## 2024-02-23 MED ORDER — METOCLOPRAMIDE HCL 10 MG PO TABS: 10.0000 mg | ORAL_TABLET | Freq: Three times a day (TID) | ORAL | 1 refills | Status: AC | PRN

## 2024-02-23 NOTE — Patient Instructions (Signed)
For prevention of migraines in pregnancy: -Magnesium, 400mg by mouth, once daily -Vitamin B2, 400mg by mouth, once daily  For treatment of migraines in pregnancy: -take medication at the first sign of the pain of a headache, or the first sign of your aura -start with 1000mg Tylenol (or excedrin tension headache with acetaminophen & caffeine only, NOT aspirin) with or without Reglan 10mg -if no relief after 1-2hours, can take Flexeril 10mg -Do not take more than 4000 mg of tylenol (acetaminophen) per day  

## 2024-02-23 NOTE — Assessment & Plan Note (Addendum)
-  Denies contractions, LOF, or vaginal bleeding. Reports good fetal movement.  -Scheduled for anatomy scan 2/6 -Tdap given today -BTL papers signed today -Missed 28 wk labs. Has missed appointments in transition from [facility] to regular care. She will return later this week for fasting labs.

## 2024-02-23 NOTE — Assessment & Plan Note (Addendum)
-  Normotensive today. Taking procardia  & aspirin  as prescribed -Reviewed preeclampsia precautions -Reports recurrent headaches. This has been ongoing with pregnancy so not concerned it is severe feature. Discussed management of symptoms & prescribed flexeril  & reglan . Encouraged to come to MAU if headache resistant to medications.

## 2024-02-26 ENCOUNTER — Ambulatory Visit (HOSPITAL_BASED_OUTPATIENT_CLINIC_OR_DEPARTMENT_OTHER): Admitting: Obstetrics and Gynecology

## 2024-02-26 ENCOUNTER — Other Ambulatory Visit

## 2024-02-26 ENCOUNTER — Other Ambulatory Visit: Payer: Self-pay

## 2024-02-26 VITALS — BP 137/83 | HR 93

## 2024-02-26 DIAGNOSIS — O10919 Unspecified pre-existing hypertension complicating pregnancy, unspecified trimester: Secondary | ICD-10-CM

## 2024-02-26 DIAGNOSIS — O361920 Maternal care for other isoimmunization, second trimester, not applicable or unspecified: Secondary | ICD-10-CM

## 2024-02-26 DIAGNOSIS — Z3A30 30 weeks gestation of pregnancy: Secondary | ICD-10-CM

## 2024-02-26 DIAGNOSIS — O09899 Supervision of other high risk pregnancies, unspecified trimester: Secondary | ICD-10-CM

## 2024-02-26 DIAGNOSIS — Z3480 Encounter for supervision of other normal pregnancy, unspecified trimester: Secondary | ICD-10-CM

## 2024-02-26 DIAGNOSIS — Z6841 Body Mass Index (BMI) 40.0 and over, adult: Secondary | ICD-10-CM

## 2024-02-26 DIAGNOSIS — Z3A29 29 weeks gestation of pregnancy: Secondary | ICD-10-CM

## 2024-02-26 DIAGNOSIS — O99213 Obesity complicating pregnancy, third trimester: Secondary | ICD-10-CM

## 2024-02-26 DIAGNOSIS — E6689 Other obesity not elsewhere classified: Secondary | ICD-10-CM

## 2024-02-26 DIAGNOSIS — I10 Essential (primary) hypertension: Secondary | ICD-10-CM

## 2024-02-26 NOTE — Progress Notes (Signed)
 After review, MFM consult with provider is not indicated for today  Arna Ranks, MD 02/26/2024 11:57 AM  [facility]

## 2024-03-08 ENCOUNTER — Encounter: Payer: Self-pay | Admitting: Obstetrics and Gynecology

## 2024-03-24 ENCOUNTER — Encounter: Payer: Self-pay | Admitting: Obstetrics and Gynecology

## 2024-03-29 ENCOUNTER — Ambulatory Visit

## 2024-03-29 ENCOUNTER — Other Ambulatory Visit
